# Patient Record
Sex: Male | Born: 1935 | Race: Black or African American | Hispanic: No | Smoking: Never smoker
Health system: Southern US, Community
[De-identification: ages and names within clinical notes are randomized; demographics above are authoritative.]

## PROBLEM LIST (undated history)

## (undated) ENCOUNTER — Emergency Department (HOSPITAL_COMMUNITY): Admission: EM | Payer: Self-pay

## (undated) DIAGNOSIS — D649 Anemia, unspecified: Secondary | ICD-10-CM

## (undated) DIAGNOSIS — M1990 Unspecified osteoarthritis, unspecified site: Secondary | ICD-10-CM

## (undated) DIAGNOSIS — I499 Cardiac arrhythmia, unspecified: Secondary | ICD-10-CM

## (undated) DIAGNOSIS — F32A Depression, unspecified: Secondary | ICD-10-CM

## (undated) DIAGNOSIS — E7800 Pure hypercholesterolemia, unspecified: Secondary | ICD-10-CM

## (undated) DIAGNOSIS — C61 Malignant neoplasm of prostate: Secondary | ICD-10-CM

## (undated) DIAGNOSIS — I82409 Acute embolism and thrombosis of unspecified deep veins of unspecified lower extremity: Secondary | ICD-10-CM

## (undated) DIAGNOSIS — I351 Nonrheumatic aortic (valve) insufficiency: Secondary | ICD-10-CM

## (undated) DIAGNOSIS — Z87442 Personal history of urinary calculi: Secondary | ICD-10-CM

## (undated) DIAGNOSIS — I10 Essential (primary) hypertension: Secondary | ICD-10-CM

## (undated) HISTORY — PX: COLONOSCOPY: SHX174

## (undated) HISTORY — PX: FOOT FRACTURE SURGERY: SHX645

## (undated) HISTORY — PX: VEIN SURGERY: SHX48

## (undated) HISTORY — PX: HERNIA REPAIR: SHX51

---
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
## 2019-01-16 DIAGNOSIS — I82409 Acute embolism and thrombosis of unspecified deep veins of unspecified lower extremity: Secondary | ICD-10-CM

## 2019-01-16 HISTORY — DX: Acute embolism and thrombosis of unspecified deep veins of unspecified lower extremity: I82.409

## 2020-07-10 ENCOUNTER — Emergency Department (HOSPITAL_COMMUNITY): Payer: Medicare HMO

## 2020-07-10 ENCOUNTER — Inpatient Hospital Stay (HOSPITAL_COMMUNITY)
Admission: EM | Admit: 2020-07-10 | Discharge: 2020-07-17 | DRG: 493 | Disposition: A | Discharge status: Home Health | Payer: Medicare HMO | Attending: General Surgery | Admitting: General Surgery

## 2020-07-10 DIAGNOSIS — I82812 Embolism and thrombosis of superficial veins of left lower extremities: Secondary | ICD-10-CM | POA: Diagnosis present

## 2020-07-10 DIAGNOSIS — Z8249 Family history of ischemic heart disease and other diseases of the circulatory system: Secondary | ICD-10-CM

## 2020-07-10 DIAGNOSIS — S8012XA Contusion of left lower leg, initial encounter: Secondary | ICD-10-CM | POA: Diagnosis present

## 2020-07-10 DIAGNOSIS — S8010XA Contusion of unspecified lower leg, initial encounter: Secondary | ICD-10-CM

## 2020-07-10 DIAGNOSIS — I10 Essential (primary) hypertension: Secondary | ICD-10-CM | POA: Diagnosis present

## 2020-07-10 DIAGNOSIS — S82871A Displaced pilon fracture of right tibia, initial encounter for closed fracture: Secondary | ICD-10-CM | POA: Diagnosis not present

## 2020-07-10 DIAGNOSIS — Y92410 Unspecified street and highway as the place of occurrence of the external cause: Secondary | ICD-10-CM

## 2020-07-10 DIAGNOSIS — I480 Paroxysmal atrial fibrillation: Secondary | ICD-10-CM

## 2020-07-10 DIAGNOSIS — S060X9A Concussion with loss of consciousness of unspecified duration, initial encounter: Secondary | ICD-10-CM | POA: Diagnosis present

## 2020-07-10 DIAGNOSIS — K8689 Other specified diseases of pancreas: Secondary | ICD-10-CM | POA: Diagnosis present

## 2020-07-10 DIAGNOSIS — K921 Melena: Secondary | ICD-10-CM | POA: Diagnosis not present

## 2020-07-10 DIAGNOSIS — E1165 Type 2 diabetes mellitus with hyperglycemia: Secondary | ICD-10-CM | POA: Diagnosis present

## 2020-07-10 DIAGNOSIS — M545 Low back pain, unspecified: Secondary | ICD-10-CM | POA: Diagnosis present

## 2020-07-10 DIAGNOSIS — T148XXA Other injury of unspecified body region, initial encounter: Secondary | ICD-10-CM

## 2020-07-10 DIAGNOSIS — Z8546 Personal history of malignant neoplasm of prostate: Secondary | ICD-10-CM

## 2020-07-10 DIAGNOSIS — I959 Hypotension, unspecified: Secondary | ICD-10-CM | POA: Diagnosis present

## 2020-07-10 DIAGNOSIS — S2249XA Multiple fractures of ribs, unspecified side, initial encounter for closed fracture: Secondary | ICD-10-CM

## 2020-07-10 DIAGNOSIS — R0989 Other specified symptoms and signs involving the circulatory and respiratory systems: Secondary | ICD-10-CM | POA: Diagnosis present

## 2020-07-10 DIAGNOSIS — I471 Supraventricular tachycardia: Secondary | ICD-10-CM | POA: Diagnosis present

## 2020-07-10 DIAGNOSIS — T1490XA Injury, unspecified, initial encounter: Secondary | ICD-10-CM

## 2020-07-10 DIAGNOSIS — S2241XA Multiple fractures of ribs, right side, initial encounter for closed fracture: Secondary | ICD-10-CM | POA: Diagnosis present

## 2020-07-10 DIAGNOSIS — D62 Acute posthemorrhagic anemia: Secondary | ICD-10-CM | POA: Diagnosis not present

## 2020-07-10 DIAGNOSIS — Z20822 Contact with and (suspected) exposure to covid-19: Secondary | ICD-10-CM | POA: Diagnosis present

## 2020-07-10 DIAGNOSIS — Z419 Encounter for procedure for purposes other than remedying health state, unspecified: Secondary | ICD-10-CM

## 2020-07-10 DIAGNOSIS — R58 Hemorrhage, not elsewhere classified: Secondary | ICD-10-CM | POA: Diagnosis present

## 2020-07-10 DIAGNOSIS — S299XXA Unspecified injury of thorax, initial encounter: Secondary | ICD-10-CM

## 2020-07-10 DIAGNOSIS — I493 Ventricular premature depolarization: Secondary | ICD-10-CM | POA: Diagnosis present

## 2020-07-10 HISTORY — DX: Essential (primary) hypertension: I10

## 2020-07-10 HISTORY — DX: Malignant neoplasm of prostate: C61

## 2020-07-10 LAB — COMPREHENSIVE METABOLIC PANEL
ALT: 16 U/L (ref 0–44)
AST: 34 U/L (ref 15–41)
Albumin: 3.1 g/dL — ABNORMAL LOW (ref 3.5–5.0)
Alkaline Phosphatase: 70 U/L (ref 38–126)
Anion gap: 9 (ref 5–15)
BUN: 17 mg/dL (ref 8–23)
CO2: 24 mmol/L (ref 22–32)
Calcium: 8.2 mg/dL — ABNORMAL LOW (ref 8.9–10.3)
Chloride: 105 mmol/L (ref 98–111)
Creatinine, Ser: 1.09 mg/dL (ref 0.61–1.24)
GFR, Estimated: >60 mL/min (ref >60)
Glucose, Bld: 180 mg/dL — ABNORMAL HIGH (ref 70–99)
Potassium: 4 mmol/L (ref 3.5–5.1)
Sodium: 138 mmol/L (ref 135–145)
Total Bilirubin: 0.9 mg/dL (ref 0.3–1.2)
Total Protein: 5.7 g/dL — ABNORMAL LOW (ref 6.5–8.1)

## 2020-07-10 LAB — LACTIC ACID, PLASMA: Lactic Acid, Venous: 3.1 mmol/L (ref 0.5–1.9)

## 2020-07-10 LAB — ETHANOL: Alcohol, Ethyl (B): <10 mg/dL (ref <10)

## 2020-07-10 LAB — I-STAT CHEM 8, ED
BUN: 24 mg/dL — ABNORMAL HIGH (ref 8–23)
Calcium, Ion: 1.13 mmol/L — ABNORMAL LOW (ref 1.15–1.40)
Chloride: 103 mmol/L (ref 98–111)
Creatinine, Ser: 1.1 mg/dL (ref 0.61–1.24)
Glucose, Bld: 173 mg/dL — ABNORMAL HIGH (ref 70–99)
HCT: 32 % — ABNORMAL LOW (ref 39.0–52.0)
Hemoglobin: 10.9 g/dL — ABNORMAL LOW (ref 13.0–17.0)
Potassium: 4.6 mmol/L (ref 3.5–5.1)
Sodium: 140 mmol/L (ref 135–145)
TCO2: 29 mmol/L (ref 22–32)

## 2020-07-10 LAB — CBC
HCT: 33.8 % — ABNORMAL LOW (ref 39.0–52.0)
Hemoglobin: 10.5 g/dL — ABNORMAL LOW (ref 13.0–17.0)
MCH: 26.3 pg (ref 26.0–34.0)
MCHC: 31.1 g/dL (ref 30.0–36.0)
MCV: 84.7 fL (ref 80.0–100.0)
Platelets: 149 10*3/uL — ABNORMAL LOW (ref 150–400)
RBC: 3.99 MIL/uL — ABNORMAL LOW (ref 4.22–5.81)
RDW: 13.6 % (ref 11.5–15.5)
WBC: 13.3 10*3/uL — ABNORMAL HIGH (ref 4.0–10.5)
nRBC: 0 % (ref 0.0–0.2)

## 2020-07-10 LAB — PROTIME-INR
INR: 1.1 (ref 0.8–1.2)
Prothrombin Time: 13.6 seconds (ref 11.4–15.2)

## 2020-07-10 MED ORDER — MORPHINE SULFATE (PF) 4 MG/ML IV SOLN
4.0000 mg | Freq: Once | INTRAVENOUS | Status: AC
  Administered 2020-07-10: 4 mg via INTRAVENOUS
  Filled 2020-07-10: qty 1

## 2020-07-10 MED ORDER — ONDANSETRON HCL 4 MG/2ML IJ SOLN
INTRAMUSCULAR | Status: AC
  Filled 2020-07-10: qty 2

## 2020-07-10 MED ORDER — ONDANSETRON HCL 4 MG/2ML IJ SOLN
4.0000 mg | Freq: Once | INTRAMUSCULAR | Status: AC
  Administered 2020-07-10: 4 mg via INTRAVENOUS

## 2020-07-10 MED ORDER — FENTANYL CITRATE (PF) 100 MCG/2ML IJ SOLN
50.0000 ug | Freq: Once | INTRAMUSCULAR | Status: AC
  Administered 2020-07-10: 50 ug via INTRAVENOUS
  Filled 2020-07-10: qty 2

## 2020-07-10 MED ORDER — SODIUM CHLORIDE 0.9 % IV BOLUS
1000.0000 mL | Freq: Once | INTRAVENOUS | Status: AC
  Administered 2020-07-10: 1000 mL via INTRAVENOUS

## 2020-07-10 NOTE — Progress Notes (Signed)
Pt states he has had tetanus shot within the past 5 years.  Also has had x2 COVID vaccines as well as pna vaccine.  TRN asked if I can call any family  -- pt states not at this time, he does not have any family that lives here (they live in [place]).
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
## 2020-07-10 NOTE — ED Notes (Signed)
Paged Dr Rosendo Gros to Dt Tegeler--Pao Haffey
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
## 2020-07-11 ENCOUNTER — Encounter (HOSPITAL_COMMUNITY): Payer: Self-pay | Admitting: Student in an Organized Health Care Education/Training Program

## 2020-07-11 ENCOUNTER — Emergency Department (HOSPITAL_COMMUNITY): Payer: Medicare HMO

## 2020-07-11 ENCOUNTER — Inpatient Hospital Stay (HOSPITAL_COMMUNITY): Payer: Medicare HMO | Admitting: Certified Registered Nurse Anesthetist

## 2020-07-11 ENCOUNTER — Inpatient Hospital Stay (HOSPITAL_COMMUNITY): Payer: Medicare HMO

## 2020-07-11 ENCOUNTER — Other Ambulatory Visit: Payer: Self-pay

## 2020-07-11 ENCOUNTER — Other Ambulatory Visit (HOSPITAL_COMMUNITY): Payer: Self-pay

## 2020-07-11 ENCOUNTER — Encounter (HOSPITAL_COMMUNITY): Admission: EM | Disposition: A | Discharge status: Home Health | Payer: Self-pay | Source: Home / Self Care

## 2020-07-11 DIAGNOSIS — Y92410 Unspecified street and highway as the place of occurrence of the external cause: Secondary | ICD-10-CM | POA: Diagnosis not present

## 2020-07-11 DIAGNOSIS — S2249XA Multiple fractures of ribs, unspecified side, initial encounter for closed fracture: Secondary | ICD-10-CM | POA: Diagnosis present

## 2020-07-11 DIAGNOSIS — I493 Ventricular premature depolarization: Secondary | ICD-10-CM | POA: Diagnosis present

## 2020-07-11 DIAGNOSIS — I471 Supraventricular tachycardia: Secondary | ICD-10-CM | POA: Diagnosis present

## 2020-07-11 DIAGNOSIS — E1165 Type 2 diabetes mellitus with hyperglycemia: Secondary | ICD-10-CM | POA: Diagnosis present

## 2020-07-11 DIAGNOSIS — R58 Hemorrhage, not elsewhere classified: Secondary | ICD-10-CM | POA: Diagnosis present

## 2020-07-11 DIAGNOSIS — K8689 Other specified diseases of pancreas: Secondary | ICD-10-CM | POA: Diagnosis present

## 2020-07-11 DIAGNOSIS — M79609 Pain in unspecified limb: Secondary | ICD-10-CM | POA: Diagnosis not present

## 2020-07-11 DIAGNOSIS — I82812 Embolism and thrombosis of superficial veins of left lower extremities: Secondary | ICD-10-CM | POA: Diagnosis present

## 2020-07-11 DIAGNOSIS — S82871A Displaced pilon fracture of right tibia, initial encounter for closed fracture: Secondary | ICD-10-CM | POA: Diagnosis present

## 2020-07-11 DIAGNOSIS — Z8249 Family history of ischemic heart disease and other diseases of the circulatory system: Secondary | ICD-10-CM | POA: Diagnosis not present

## 2020-07-11 DIAGNOSIS — S2241XA Multiple fractures of ribs, right side, initial encounter for closed fracture: Secondary | ICD-10-CM | POA: Diagnosis present

## 2020-07-11 DIAGNOSIS — I351 Nonrheumatic aortic (valve) insufficiency: Secondary | ICD-10-CM | POA: Diagnosis not present

## 2020-07-11 DIAGNOSIS — R609 Edema, unspecified: Secondary | ICD-10-CM | POA: Diagnosis not present

## 2020-07-11 DIAGNOSIS — I480 Paroxysmal atrial fibrillation: Secondary | ICD-10-CM | POA: Diagnosis present

## 2020-07-11 DIAGNOSIS — S8012XA Contusion of left lower leg, initial encounter: Secondary | ICD-10-CM | POA: Diagnosis present

## 2020-07-11 DIAGNOSIS — S060X9A Concussion with loss of consciousness of unspecified duration, initial encounter: Secondary | ICD-10-CM | POA: Diagnosis present

## 2020-07-11 DIAGNOSIS — I959 Hypotension, unspecified: Secondary | ICD-10-CM | POA: Diagnosis present

## 2020-07-11 DIAGNOSIS — I10 Essential (primary) hypertension: Secondary | ICD-10-CM | POA: Diagnosis present

## 2020-07-11 DIAGNOSIS — M7989 Other specified soft tissue disorders: Secondary | ICD-10-CM | POA: Diagnosis not present

## 2020-07-11 DIAGNOSIS — M545 Low back pain, unspecified: Secondary | ICD-10-CM | POA: Diagnosis present

## 2020-07-11 DIAGNOSIS — Z20822 Contact with and (suspected) exposure to covid-19: Secondary | ICD-10-CM | POA: Diagnosis present

## 2020-07-11 DIAGNOSIS — K921 Melena: Secondary | ICD-10-CM | POA: Diagnosis not present

## 2020-07-11 DIAGNOSIS — Z8546 Personal history of malignant neoplasm of prostate: Secondary | ICD-10-CM | POA: Diagnosis not present

## 2020-07-11 DIAGNOSIS — R0989 Other specified symptoms and signs involving the circulatory and respiratory systems: Secondary | ICD-10-CM | POA: Diagnosis present

## 2020-07-11 DIAGNOSIS — D62 Acute posthemorrhagic anemia: Secondary | ICD-10-CM | POA: Diagnosis not present

## 2020-07-11 HISTORY — PX: EXTERNAL FIXATION LEG: SHX1549

## 2020-07-11 LAB — CBC
HCT: 30.2 % — ABNORMAL LOW (ref 39.0–52.0)
HCT: 30.9 % — ABNORMAL LOW (ref 39.0–52.0)
HCT: 31.5 % — ABNORMAL LOW (ref 39.0–52.0)
HCT: 28.6 % — ABNORMAL LOW (ref 39.0–52.0)
Hemoglobin: 9.4 g/dL — ABNORMAL LOW (ref 13.0–17.0)
Hemoglobin: 9.7 g/dL — ABNORMAL LOW (ref 13.0–17.0)
Hemoglobin: 9.8 g/dL — ABNORMAL LOW (ref 13.0–17.0)
Hemoglobin: 9.2 g/dL — ABNORMAL LOW (ref 13.0–17.0)
MCH: 26.6 pg (ref 26.0–34.0)
MCH: 26.9 pg (ref 26.0–34.0)
MCH: 26.6 pg (ref 26.0–34.0)
MCH: 26.8 pg (ref 26.0–34.0)
MCHC: 31.1 g/dL (ref 30.0–36.0)
MCHC: 31.4 g/dL (ref 30.0–36.0)
MCHC: 31.1 g/dL (ref 30.0–36.0)
MCHC: 32.2 g/dL (ref 30.0–36.0)
MCV: 85.6 fL (ref 80.0–100.0)
MCV: 85.8 fL (ref 80.0–100.0)
MCV: 85.6 fL (ref 80.0–100.0)
MCV: 83.4 fL (ref 80.0–100.0)
Platelets: 149 10*3/uL — ABNORMAL LOW (ref 150–400)
Platelets: 124 10*3/uL — ABNORMAL LOW (ref 150–400)
Platelets: 122 10*3/uL — ABNORMAL LOW (ref 150–400)
Platelets: 114 10*3/uL — ABNORMAL LOW (ref 150–400)
RBC: 3.53 MIL/uL — ABNORMAL LOW (ref 4.22–5.81)
RBC: 3.6 MIL/uL — ABNORMAL LOW (ref 4.22–5.81)
RBC: 3.68 MIL/uL — ABNORMAL LOW (ref 4.22–5.81)
RBC: 3.43 MIL/uL — ABNORMAL LOW (ref 4.22–5.81)
RDW: 13.8 % (ref 11.5–15.5)
RDW: 13.7 % (ref 11.5–15.5)
RDW: 13.6 % (ref 11.5–15.5)
RDW: 13.8 % (ref 11.5–15.5)
WBC: 15.2 10*3/uL — ABNORMAL HIGH (ref 4.0–10.5)
WBC: 11.6 10*3/uL — ABNORMAL HIGH (ref 4.0–10.5)
WBC: 12.1 10*3/uL — ABNORMAL HIGH (ref 4.0–10.5)
WBC: 11.8 10*3/uL — ABNORMAL HIGH (ref 4.0–10.5)
nRBC: 0 % (ref 0.0–0.2)
nRBC: 0 % (ref 0.0–0.2)
nRBC: 0 % (ref 0.0–0.2)
nRBC: 0 % (ref 0.0–0.2)

## 2020-07-11 LAB — URINALYSIS, ROUTINE W REFLEX MICROSCOPIC
Bacteria, UA: NONE SEEN
Bilirubin Urine: NEGATIVE
Glucose, UA: NEGATIVE mg/dL
Ketones, ur: NEGATIVE mg/dL
Leukocytes,Ua: NEGATIVE
Nitrite: NEGATIVE
Protein, ur: NEGATIVE mg/dL
Specific Gravity, Urine: 1.021 (ref 1.005–1.030)
pH: 6 (ref 5.0–8.0)

## 2020-07-11 LAB — HEMOGLOBIN A1C
Hgb A1c MFr Bld: 5.8 % — ABNORMAL HIGH (ref 4.8–5.6)
Mean Plasma Glucose: 120 mg/dL

## 2020-07-11 LAB — MRSA PCR SCREENING: MRSA by PCR: NEGATIVE

## 2020-07-11 LAB — COMPREHENSIVE METABOLIC PANEL
ALT: 15 U/L (ref 0–44)
AST: 34 U/L (ref 15–41)
Albumin: 2.7 g/dL — ABNORMAL LOW (ref 3.5–5.0)
Alkaline Phosphatase: 55 U/L (ref 38–126)
Anion gap: 7 (ref 5–15)
BUN: 13 mg/dL (ref 8–23)
CO2: 25 mmol/L (ref 22–32)
Calcium: 7.4 mg/dL — ABNORMAL LOW (ref 8.9–10.3)
Chloride: 109 mmol/L (ref 98–111)
Creatinine, Ser: 1.02 mg/dL (ref 0.61–1.24)
GFR, Estimated: >60 mL/min (ref >60)
Glucose, Bld: 218 mg/dL — ABNORMAL HIGH (ref 70–99)
Potassium: 4.7 mmol/L (ref 3.5–5.1)
Sodium: 141 mmol/L (ref 135–145)
Total Bilirubin: 1 mg/dL (ref 0.3–1.2)
Total Protein: 5.3 g/dL — ABNORMAL LOW (ref 6.5–8.1)

## 2020-07-11 LAB — RESPIRATORY PANEL BY RT PCR (FLU A&B, COVID)
Influenza A by PCR: NEGATIVE
Influenza B by PCR: NEGATIVE
SARS Coronavirus 2 by RT PCR: NEGATIVE

## 2020-07-11 LAB — CBG MONITORING, ED
Glucose-Capillary: 147 mg/dL — ABNORMAL HIGH (ref 70–99)
Glucose-Capillary: 153 mg/dL — ABNORMAL HIGH (ref 70–99)

## 2020-07-11 LAB — GLUCOSE, CAPILLARY
Glucose-Capillary: 105 mg/dL — ABNORMAL HIGH (ref 70–99)
Glucose-Capillary: 113 mg/dL — ABNORMAL HIGH (ref 70–99)
Glucose-Capillary: 130 mg/dL — ABNORMAL HIGH (ref 70–99)

## 2020-07-11 LAB — ABO/RH: ABO/RH(D): O POS

## 2020-07-11 LAB — LACTIC ACID, PLASMA: Lactic Acid, Venous: 3.5 mmol/L (ref 0.5–1.9)

## 2020-07-11 SURGERY — EXTERNAL FIXATION, LOWER EXTREMITY
Anesthesia: General | Site: Ankle | Laterality: Right

## 2020-07-11 MED ORDER — CHLORHEXIDINE GLUCONATE 4 % EX LIQD
60.0000 mL | Freq: Once | CUTANEOUS | Status: DC
  Filled 2020-07-11: qty 60

## 2020-07-11 MED ORDER — PROPOFOL 10 MG/ML IV BOLUS
INTRAVENOUS | Status: DC | PRN
  Administered 2020-07-11: 100 mg via INTRAVENOUS

## 2020-07-11 MED ORDER — DEXAMETHASONE SODIUM PHOSPHATE 10 MG/ML IJ SOLN
INTRAMUSCULAR | Status: AC
  Filled 2020-07-11: qty 1

## 2020-07-11 MED ORDER — DEXAMETHASONE SODIUM PHOSPHATE 10 MG/ML IJ SOLN
INTRAMUSCULAR | Status: DC | PRN
  Administered 2020-07-11: 5 mg via INTRAVENOUS

## 2020-07-11 MED ORDER — FENTANYL CITRATE (PF) 100 MCG/2ML IJ SOLN
INTRAMUSCULAR | Status: AC
  Administered 2020-07-11: 50 ug via INTRAVENOUS
  Filled 2020-07-11: qty 2

## 2020-07-11 MED ORDER — PROPOFOL 10 MG/ML IV BOLUS
INTRAVENOUS | Status: AC
  Filled 2020-07-11: qty 40

## 2020-07-11 MED ORDER — EPHEDRINE 5 MG/ML INJ
INTRAVENOUS | Status: AC
  Filled 2020-07-11: qty 10

## 2020-07-11 MED ORDER — OXYCODONE HCL 5 MG PO TABS: 5.0000 mg | ORAL_TABLET | Freq: Once | ORAL | Status: DC | PRN

## 2020-07-11 MED ORDER — ONDANSETRON HCL 4 MG/2ML IJ SOLN: 4.0000 mg | Freq: Four times a day (QID) | INTRAMUSCULAR | Status: DC | PRN

## 2020-07-11 MED ORDER — ONDANSETRON HCL 4 MG/2ML IJ SOLN: 4.0000 mg | Freq: Once | INTRAMUSCULAR | Status: DC | PRN

## 2020-07-11 MED ORDER — FENTANYL CITRATE (PF) 250 MCG/5ML IJ SOLN
INTRAMUSCULAR | Status: DC | PRN
  Administered 2020-07-11: 50 ug via INTRAVENOUS

## 2020-07-11 MED ORDER — FENTANYL CITRATE (PF) 100 MCG/2ML IJ SOLN
25.0000 ug | INTRAMUSCULAR | Status: DC | PRN
  Administered 2020-07-11: 50 ug via INTRAVENOUS

## 2020-07-11 MED ORDER — IOHEXOL 350 MG/ML SOLN
100.0000 mL | Freq: Once | INTRAVENOUS | Status: AC | PRN
  Administered 2020-07-11: 100 mL via INTRAVENOUS

## 2020-07-11 MED ORDER — TAMSULOSIN HCL 0.4 MG PO CAPS
0.4000 mg | ORAL_CAPSULE | Freq: Every day | ORAL | Status: DC
  Administered 2020-07-12 – 2020-07-17 (×6): 0.4 mg via ORAL
  Filled 2020-07-11 (×6): qty 1

## 2020-07-11 MED ORDER — ONDANSETRON 4 MG PO TBDP: 4.0000 mg | ORAL_TABLET | Freq: Four times a day (QID) | ORAL | Status: DC | PRN

## 2020-07-11 MED ORDER — PHENYLEPHRINE 40 MCG/ML (10ML) SYRINGE FOR IV PUSH (FOR BLOOD PRESSURE SUPPORT)
PREFILLED_SYRINGE | INTRAVENOUS | Status: DC | PRN
  Administered 2020-07-11: 120 ug via INTRAVENOUS

## 2020-07-11 MED ORDER — CHLORHEXIDINE GLUCONATE 0.12 % MT SOLN: 15.0000 mL | Freq: Once | OROMUCOSAL | Status: AC

## 2020-07-11 MED ORDER — PHENYLEPHRINE 40 MCG/ML (10ML) SYRINGE FOR IV PUSH (FOR BLOOD PRESSURE SUPPORT)
PREFILLED_SYRINGE | INTRAVENOUS | Status: AC
  Filled 2020-07-11: qty 10

## 2020-07-11 MED ORDER — EPHEDRINE SULFATE-NACL 50-0.9 MG/10ML-% IV SOSY
PREFILLED_SYRINGE | INTRAVENOUS | Status: DC | PRN
  Administered 2020-07-11 (×2): 10 mg via INTRAVENOUS

## 2020-07-11 MED ORDER — PHENYLEPHRINE HCL-NACL 10-0.9 MG/250ML-% IV SOLN
INTRAVENOUS | Status: DC | PRN
  Administered 2020-07-11: 25 ug/min via INTRAVENOUS

## 2020-07-11 MED ORDER — MORPHINE SULFATE (PF) 2 MG/ML IV SOLN
2.0000 mg | INTRAVENOUS | Status: DC | PRN
  Administered 2020-07-11: 2 mg via INTRAVENOUS
  Filled 2020-07-11: qty 1

## 2020-07-11 MED ORDER — 0.9 % SODIUM CHLORIDE (POUR BTL) OPTIME
TOPICAL | Status: DC | PRN
  Administered 2020-07-11: 1000 mL

## 2020-07-11 MED ORDER — LACTATED RINGERS IV SOLN: INTRAVENOUS | Status: DC | PRN

## 2020-07-11 MED ORDER — KCL IN DEXTROSE-NACL 20-5-0.45 MEQ/L-%-% IV SOLN
INTRAVENOUS | Status: DC
  Filled 2020-07-11 (×4): qty 1000

## 2020-07-11 MED ORDER — ENOXAPARIN SODIUM 40 MG/0.4ML ~~LOC~~ SOLN: 40.0000 mg | SUBCUTANEOUS | Status: DC

## 2020-07-11 MED ORDER — OXYCODONE HCL 5 MG/5ML PO SOLN: 5.0000 mg | Freq: Once | ORAL | Status: DC | PRN

## 2020-07-11 MED ORDER — AMISULPRIDE (ANTIEMETIC) 5 MG/2ML IV SOLN: 10.0000 mg | Freq: Once | INTRAVENOUS | Status: DC | PRN

## 2020-07-11 MED ORDER — OXYCODONE HCL 5 MG PO TABS
5.0000 mg | ORAL_TABLET | ORAL | Status: DC | PRN
  Administered 2020-07-12: 10 mg via ORAL
  Filled 2020-07-11: qty 2

## 2020-07-11 MED ORDER — ONDANSETRON HCL 4 MG/2ML IJ SOLN
INTRAMUSCULAR | Status: AC
  Filled 2020-07-11: qty 2

## 2020-07-11 MED ORDER — HYDROMORPHONE HCL 1 MG/ML IJ SOLN
1.0000 mg | INTRAMUSCULAR | Status: DC | PRN
  Filled 2020-07-11: qty 1

## 2020-07-11 MED ORDER — FENTANYL CITRATE (PF) 250 MCG/5ML IJ SOLN
INTRAMUSCULAR | Status: AC
  Filled 2020-07-11: qty 5

## 2020-07-11 MED ORDER — INSULIN ASPART 100 UNIT/ML ~~LOC~~ SOLN
0.0000 [IU] | SUBCUTANEOUS | Status: DC
  Administered 2020-07-11: 2 [IU] via SUBCUTANEOUS
  Administered 2020-07-12: 3 [IU] via SUBCUTANEOUS
  Administered 2020-07-12 – 2020-07-13 (×2): 2 [IU] via SUBCUTANEOUS

## 2020-07-11 MED ORDER — CHLORHEXIDINE GLUCONATE 0.12 % MT SOLN
OROMUCOSAL | Status: AC
  Administered 2020-07-11: 15 mL via OROMUCOSAL
  Filled 2020-07-11: qty 15

## 2020-07-11 MED ORDER — LACTATED RINGERS IV SOLN: INTRAVENOUS | Status: DC

## 2020-07-11 MED ORDER — ACETAMINOPHEN 325 MG PO TABS
650.0000 mg | ORAL_TABLET | Freq: Four times a day (QID) | ORAL | Status: DC
  Administered 2020-07-11 – 2020-07-17 (×23): 650 mg via ORAL
  Filled 2020-07-11 (×23): qty 2

## 2020-07-11 MED ORDER — CEFAZOLIN SODIUM-DEXTROSE 2-4 GM/100ML-% IV SOLN
2.0000 g | INTRAVENOUS | Status: AC
  Administered 2020-07-11: 2 g via INTRAVENOUS
  Filled 2020-07-11: qty 100

## 2020-07-11 MED ORDER — POVIDONE-IODINE 10 % EX SWAB: 2.0000 "application " | Freq: Once | CUTANEOUS | Status: DC

## 2020-07-11 SURGICAL SUPPLY — 54 items
BAR GLASS FIBER EXFX 11X150 (EXFIX) ×1 IMPLANT
BAR GLASS FIBER EXFX 11X200 (EXFIX) ×1 IMPLANT
BAR GLASS FIBER EXFX 11X400 (EXFIX) ×2 IMPLANT
BIT DRILL 3.2 XTRAFIX BLUE (BIT) ×1 IMPLANT
BIT DRILL CANN MED FLUTE 4.0 (BIT) IMPLANT
BNDG COHESIVE 4X5 TAN STRL (GAUZE/BANDAGES/DRESSINGS) ×2 IMPLANT
BNDG ELASTIC 4X5.8 VLCR STR LF (GAUZE/BANDAGES/DRESSINGS) ×2 IMPLANT
BNDG ELASTIC 6X5.8 VLCR STR LF (GAUZE/BANDAGES/DRESSINGS) ×2 IMPLANT
BNDG GAUZE ELAST 4 BULKY (GAUZE/BANDAGES/DRESSINGS) ×3 IMPLANT
BRUSH SCRUB EZ PLAIN DRY (MISCELLANEOUS) ×4 IMPLANT
CAP PROTECTIVE TRANSFX 4.5X5MM (EXFIX) ×1 IMPLANT
CHLORAPREP W/TINT 26 (MISCELLANEOUS) ×2 IMPLANT
CLAMP BLUE BAR TO BAR (EXFIX) ×2 IMPLANT
CLAMP BLUE BAR TO PIN (EXFIX) ×4 IMPLANT
COVER SURGICAL LIGHT HANDLE (MISCELLANEOUS) ×4 IMPLANT
COVER WAND RF STERILE (DRAPES) ×1 IMPLANT
DRAPE C-ARM 42X72 X-RAY (DRAPES) IMPLANT
DRAPE C-ARMOR (DRAPES) ×2 IMPLANT
DRAPE IMP U-DRAPE 54X76 (DRAPES) ×4 IMPLANT
DRAPE ORTHO SPLIT 77X108 STRL (DRAPES) ×2
DRAPE SURG ORHT 6 SPLT 77X108 (DRAPES) ×2 IMPLANT
DRAPE U-SHAPE 47X51 STRL (DRAPES) ×2 IMPLANT
DRILL CANN 4.0MM (BIT) ×2
DRSG MEPITEL 4X7.2 (GAUZE/BANDAGES/DRESSINGS) IMPLANT
ELECT REM PT RETURN 9FT ADLT (ELECTROSURGICAL) ×2
ELECTRODE REM PT RTRN 9FT ADLT (ELECTROSURGICAL) ×1 IMPLANT
GAUZE SPONGE 4X4 12PLY STRL (GAUZE/BANDAGES/DRESSINGS) ×2 IMPLANT
GLOVE BIO SURGEON STRL SZ 6.5 (GLOVE) ×6 IMPLANT
GLOVE BIO SURGEON STRL SZ7.5 (GLOVE) ×8 IMPLANT
GLOVE BIOGEL PI IND STRL 6.5 (GLOVE) ×1 IMPLANT
GLOVE BIOGEL PI IND STRL 7.5 (GLOVE) ×1 IMPLANT
GLOVE BIOGEL PI INDICATOR 6.5 (GLOVE) ×1
GLOVE BIOGEL PI INDICATOR 7.5 (GLOVE) ×1
GOWN STRL REUS W/ TWL LRG LVL3 (GOWN DISPOSABLE) ×2 IMPLANT
GOWN STRL REUS W/TWL LRG LVL3 (GOWN DISPOSABLE) ×2
KIT BASIN OR (CUSTOM PROCEDURE TRAY) ×2 IMPLANT
KIT TURNOVER KIT B (KITS) ×2 IMPLANT
MANIFOLD NEPTUNE II (INSTRUMENTS) ×2 IMPLANT
NEEDLE 22X1 1/2 (OR ONLY) (NEEDLE) IMPLANT
NS IRRIG 1000ML POUR BTL (IV SOLUTION) ×2 IMPLANT
PACK ORTHO EXTREMITY (CUSTOM PROCEDURE TRAY) ×2 IMPLANT
PAD ARMBOARD 7.5X6 YLW CONV (MISCELLANEOUS) ×4 IMPLANT
PADDING CAST COTTON 6X4 STRL (CAST SUPPLIES) ×3 IMPLANT
PIN 4X100X20MM EXFIX LG BLUNT (EXFIX) ×2 IMPLANT
PIN CLAMP 2BAR 75MM BLUE (EXFIX) ×1 IMPLANT
PIN HALF 5X160X35 BLUNT TIP (EXFIX) ×2 IMPLANT
PIN TRANSFIXING 5.0 (EXFIX) ×1 IMPLANT
SPONGE LAP 18X18 RF (DISPOSABLE) ×2 IMPLANT
STAPLER VISISTAT 35W (STAPLE) ×2 IMPLANT
STRIP CLOSURE SKIN 1/2X4 (GAUZE/BANDAGES/DRESSINGS) IMPLANT
SUT PROLENE 0 CT (SUTURE) IMPLANT
TOWEL GREEN STERILE (TOWEL DISPOSABLE) ×4 IMPLANT
TOWEL GREEN STERILE FF (TOWEL DISPOSABLE) ×4 IMPLANT
UNDERPAD 30X36 HEAVY ABSORB (UNDERPADS AND DIAPERS) ×2 IMPLANT

## 2020-07-11 NOTE — ED Notes (Signed)
Valuables inventoried and locked in security

## 2020-07-11 NOTE — ED Notes (Addendum)
Patient became hypotensive while in bed. Layed down and  Dr. Roxanne Mins made aware. Emergency release blood initiated. Trauma resident at bedside.

## 2020-07-11 NOTE — Anesthesia Postprocedure Evaluation (Signed)
Anesthesia Post Note  Patient: Dillon Tran  Procedure(s) Performed: EXTERNAL FIXATION LEG (Right Ankle)     Patient location during evaluation: PACU Anesthesia Type: General Level of consciousness: awake and alert Pain management: pain level controlled Vital Signs Assessment: post-procedure vital signs reviewed and stable Respiratory status: spontaneous breathing, nonlabored ventilation and respiratory function stable Cardiovascular status: blood pressure returned to baseline and stable Postop Assessment: no apparent nausea or vomiting Anesthetic complications: no   No complications documented.  Last Vitals:  Vitals:   07/11/20 1100 07/11/20 1107  BP: 120/76   Pulse: 91   Resp: (!) 21   Temp:  36.9 C  SpO2: 97%     Last Pain:  Vitals:   07/11/20 1130  TempSrc:   PainSc: 0-No pain                 Lidia Collum
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
## 2020-07-11 NOTE — Progress Notes (Signed)
Orthopedic Tech Progress Note Patient Details:  Dillon Tran 28-Apr-1934 [id number]  Ortho Devices Type of Ortho Device: Post (short leg) splint, Stirrup splint Ortho Device/Splint Location: rle Ortho Device/Splint Interventions: Ordered, Application, Adjustment   Post Interventions Patient Tolerated: Well Instructions Provided: Care of device, Adjustment of device   Karolee Stamps 07/11/2020, 1:18 AM
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
## 2020-07-11 NOTE — Progress Notes (Signed)
Pt is scheduled for external fixators for ankle fracture later today.  Follow up when MD requests.   07/11/20 1100  PT Visit Information  Reason Eval/Treat Not Completed Medical issues which prohibited therapy    Mee Hives, PT MS Acute Rehab Dept. Number: [place]

## 2020-07-11 NOTE — Progress Notes (Signed)
Daughter - Phill Myron - updated via phone, she lives in [place] and will plan to travel down. [phone]

## 2020-07-11 NOTE — ED Provider Notes (Signed)
Called to see the patient because of recurrent hypotension.  Patient is noted to be diaphoretic with blood pressure 58 systolic.  He is given additional IV fluids.  He reports were reviewed.  CT chest, abdomen, pelvis showed soft tissue hematoma and contusion overlying the right abdominal wall and pelvis with areas of active extravasation, right posterior upper abdominal retroperitoneal hemorrhage with areas of active extravasation.  Given patient's recurrent episodes of hypotension and areas of active extravasation, he will be given blood transfusion.  I have talked with Dr. Rob Hickman of trauma service who is coming to evaluate the patient.  CRITICAL CARE Performed by: Delora Fuel Total critical care time: 35 minutes Critical care time was exclusive of separately billable procedures and treating other patients. Critical care was necessary to treat or prevent imminent or life-threatening deterioration. Critical care was time spent personally by me on the following activities: development of treatment plan with patient and/or surrogate as well as nursing, discussions with consultants, evaluation of patient's response to treatment, examination of patient, obtaining history from patient or surrogate, ordering and performing treatments and interventions, ordering and review of laboratory studies, ordering and review of radiographic studies, pulse oximetry and re-evaluation of patient's condition.   Delora Fuel, MD 12/29/62 [phone]

## 2020-07-11 NOTE — ED Notes (Signed)
Patient assessed for blood product reaction and none noted at this time.

## 2020-07-11 NOTE — Anesthesia Procedure Notes (Signed)
Procedure Name: LMA Insertion Date/Time: 07/11/2020 1:08 PM Performed by: Rande Brunt, CRNA Pre-anesthesia Checklist: Patient identified, Emergency Drugs available, Suction available and Patient being monitored Patient Re-evaluated:Patient Re-evaluated prior to induction Oxygen Delivery Method: Circle System Utilized Preoxygenation: Pre-oxygenation with 100% oxygen Induction Type: IV induction Ventilation: Mask ventilation without difficulty LMA: LMA inserted LMA Size: 4.0 Number of attempts: 1 Placement Confirmation: positive ETCO2 Tube secured with: Tape Dental Injury: Teeth and Oropharynx as per pre-operative assessment

## 2020-07-11 NOTE — Progress Notes (Signed)
Patient arrived to unit from PACU. VS WNL. No complaints of pain or distress at this time. Oriented to unit. Granddaughter at bedside. Spoke with Dr. Kieth Brightly to advance diet d/t injuries and concussion will continue NPO at this time. Bed in low position, wheels locked, call light in reach.

## 2020-07-11 NOTE — ED Notes (Signed)
Patient became hypotensive and diaphoretic. Provider called to bedside and fluids initiated.

## 2020-07-11 NOTE — Transfer of Care (Signed)
Immediate Anesthesia Transfer of Care Note  Patient: Dillon Tran  Procedure(s) Performed: EXTERNAL FIXATION LEG (Right Ankle)  Patient Location: PACU  Anesthesia Type:General  Level of Consciousness: drowsy and patient cooperative  Airway & Oxygen Therapy: Patient Spontanous Breathing and Patient connected to face mask oxygen  Post-op Assessment: Report given to RN, Post -op Vital signs reviewed and stable and Patient moving all extremities X 4  Post vital signs: Reviewed and stable  Last Vitals:  Vitals Value Taken Time  BP 140/82   Temp    Pulse 92 07/11/20 1356  Resp 31 07/11/20 1356  SpO2 100 % 07/11/20 1356  Vitals shown include unvalidated device data.  Last Pain:  Vitals:   07/11/20 1130  TempSrc:   PainSc: 0-No pain         Complications: No complications documented.

## 2020-07-12 ENCOUNTER — Inpatient Hospital Stay (HOSPITAL_COMMUNITY): Payer: Medicare HMO

## 2020-07-12 ENCOUNTER — Encounter (HOSPITAL_COMMUNITY): Payer: Self-pay | Admitting: Student

## 2020-07-12 DIAGNOSIS — R609 Edema, unspecified: Secondary | ICD-10-CM

## 2020-07-12 DIAGNOSIS — M79609 Pain in unspecified limb: Secondary | ICD-10-CM | POA: Diagnosis not present

## 2020-07-12 DIAGNOSIS — M7989 Other specified soft tissue disorders: Secondary | ICD-10-CM

## 2020-07-12 LAB — COMPREHENSIVE METABOLIC PANEL
ALT: 22 U/L (ref 0–44)
AST: 51 U/L — ABNORMAL HIGH (ref 15–41)
Albumin: 3.1 g/dL — ABNORMAL LOW (ref 3.5–5.0)
Alkaline Phosphatase: 56 U/L (ref 38–126)
Anion gap: 10 (ref 5–15)
BUN: 10 mg/dL (ref 8–23)
CO2: 24 mmol/L (ref 22–32)
Calcium: 8 mg/dL — ABNORMAL LOW (ref 8.9–10.3)
Chloride: 105 mmol/L (ref 98–111)
Creatinine, Ser: 1.08 mg/dL (ref 0.61–1.24)
GFR, Estimated: >60 mL/min (ref >60)
Glucose, Bld: 115 mg/dL — ABNORMAL HIGH (ref 70–99)
Potassium: 3.7 mmol/L (ref 3.5–5.1)
Sodium: 139 mmol/L (ref 135–145)
Total Bilirubin: 0.9 mg/dL (ref 0.3–1.2)
Total Protein: 6 g/dL — ABNORMAL LOW (ref 6.5–8.1)

## 2020-07-12 LAB — CBC
HCT: 26.9 % — ABNORMAL LOW (ref 39.0–52.0)
HCT: 26.5 % — ABNORMAL LOW (ref 39.0–52.0)
HCT: 25.2 % — ABNORMAL LOW (ref 39.0–52.0)
Hemoglobin: 8.7 g/dL — ABNORMAL LOW (ref 13.0–17.0)
Hemoglobin: 8.6 g/dL — ABNORMAL LOW (ref 13.0–17.0)
Hemoglobin: 8.1 g/dL — ABNORMAL LOW (ref 13.0–17.0)
MCH: 26.9 pg (ref 26.0–34.0)
MCH: 27 pg (ref 26.0–34.0)
MCH: 26.6 pg (ref 26.0–34.0)
MCHC: 32.3 g/dL (ref 30.0–36.0)
MCHC: 32.5 g/dL (ref 30.0–36.0)
MCHC: 32.1 g/dL (ref 30.0–36.0)
MCV: 83 fL (ref 80.0–100.0)
MCV: 83.1 fL (ref 80.0–100.0)
MCV: 82.9 fL (ref 80.0–100.0)
Platelets: 115 10*3/uL — ABNORMAL LOW (ref 150–400)
Platelets: 125 10*3/uL — ABNORMAL LOW (ref 150–400)
Platelets: 113 10*3/uL — ABNORMAL LOW (ref 150–400)
RBC: 3.24 MIL/uL — ABNORMAL LOW (ref 4.22–5.81)
RBC: 3.19 MIL/uL — ABNORMAL LOW (ref 4.22–5.81)
RBC: 3.04 MIL/uL — ABNORMAL LOW (ref 4.22–5.81)
RDW: 13.9 % (ref 11.5–15.5)
RDW: 13.9 % (ref 11.5–15.5)
RDW: 14 % (ref 11.5–15.5)
WBC: 11.1 10*3/uL — ABNORMAL HIGH (ref 4.0–10.5)
WBC: 10.2 10*3/uL (ref 4.0–10.5)
WBC: 9 10*3/uL (ref 4.0–10.5)
nRBC: 0 % (ref 0.0–0.2)
nRBC: 0 % (ref 0.0–0.2)
nRBC: 0 % (ref 0.0–0.2)

## 2020-07-12 LAB — GLUCOSE, CAPILLARY
Glucose-Capillary: 103 mg/dL — ABNORMAL HIGH (ref 70–99)
Glucose-Capillary: 119 mg/dL — ABNORMAL HIGH (ref 70–99)
Glucose-Capillary: 147 mg/dL — ABNORMAL HIGH (ref 70–99)
Glucose-Capillary: 154 mg/dL — ABNORMAL HIGH (ref 70–99)
Glucose-Capillary: 83 mg/dL (ref 70–99)
Glucose-Capillary: 92 mg/dL (ref 70–99)

## 2020-07-12 LAB — VITAMIN D 25 HYDROXY (VIT D DEFICIENCY, FRACTURES): Vit D, 25-Hydroxy: 23.94 ng/mL — ABNORMAL LOW (ref 30–100)

## 2020-07-12 MED ORDER — GABAPENTIN 100 MG PO CAPS
100.0000 mg | ORAL_CAPSULE | Freq: Three times a day (TID) | ORAL | Status: DC
  Administered 2020-07-12 – 2020-07-17 (×16): 100 mg via ORAL
  Filled 2020-07-12 (×16): qty 1

## 2020-07-12 MED ORDER — VITAMIN D 25 MCG (1000 UNIT) PO TABS
2000.0000 [IU] | ORAL_TABLET | Freq: Every day | ORAL | Status: DC
  Administered 2020-07-13 – 2020-07-17 (×5): 2000 [IU] via ORAL
  Filled 2020-07-12 (×5): qty 2

## 2020-07-12 MED ORDER — ASPIRIN EC 81 MG PO TBEC
81.0000 mg | DELAYED_RELEASE_TABLET | Freq: Every day | ORAL | Status: DC
  Administered 2020-07-12 – 2020-07-17 (×6): 81 mg via ORAL
  Filled 2020-07-12 (×6): qty 1

## 2020-07-12 MED ORDER — METOCLOPRAMIDE HCL 5 MG/ML IJ SOLN: 5.0000 mg | Freq: Three times a day (TID) | INTRAMUSCULAR | Status: DC | PRN

## 2020-07-12 MED ORDER — METHOCARBAMOL 500 MG PO TABS
500.0000 mg | ORAL_TABLET | Freq: Four times a day (QID) | ORAL | Status: DC | PRN
  Administered 2020-07-12: 500 mg via ORAL
  Filled 2020-07-12: qty 1

## 2020-07-12 MED ORDER — METHOCARBAMOL 1000 MG/10ML IJ SOLN
500.0000 mg | Freq: Four times a day (QID) | INTRAVENOUS | Status: DC | PRN
  Filled 2020-07-12: qty 5

## 2020-07-12 MED ORDER — SODIUM CHLORIDE 0.9 % IV SOLN: INTRAVENOUS | Status: DC

## 2020-07-12 MED ORDER — ENOXAPARIN SODIUM 40 MG/0.4ML ~~LOC~~ SOLN
40.0000 mg | SUBCUTANEOUS | Status: DC
  Administered 2020-07-12 – 2020-07-17 (×6): 40 mg via SUBCUTANEOUS
  Filled 2020-07-12 (×6): qty 0.4

## 2020-07-12 MED ORDER — METOCLOPRAMIDE HCL 10 MG PO TABS: 5.0000 mg | ORAL_TABLET | Freq: Three times a day (TID) | ORAL | Status: DC | PRN

## 2020-07-12 MED ORDER — POLYETHYLENE GLYCOL 3350 17 G PO PACK
17.0000 g | PACK | Freq: Every day | ORAL | Status: DC | PRN
  Administered 2020-07-13: 17 g via ORAL
  Filled 2020-07-12: qty 1

## 2020-07-12 MED ORDER — HYDROMORPHONE HCL 1 MG/ML IJ SOLN: 0.5000 mg | INTRAMUSCULAR | Status: DC | PRN

## 2020-07-12 MED ORDER — CEFAZOLIN SODIUM-DEXTROSE 2-4 GM/100ML-% IV SOLN
2.0000 g | Freq: Two times a day (BID) | INTRAVENOUS | Status: AC
  Administered 2020-07-12 (×2): 2 g via INTRAVENOUS
  Filled 2020-07-12 (×2): qty 100

## 2020-07-12 MED ORDER — AMLODIPINE BESYLATE 5 MG PO TABS
5.0000 mg | ORAL_TABLET | Freq: Every day | ORAL | Status: DC
  Administered 2020-07-12 – 2020-07-17 (×6): 5 mg via ORAL
  Filled 2020-07-12 (×6): qty 1

## 2020-07-12 MED ORDER — ONDANSETRON HCL 4 MG PO TABS: 4.0000 mg | ORAL_TABLET | Freq: Four times a day (QID) | ORAL | Status: DC | PRN

## 2020-07-12 MED ORDER — DOCUSATE SODIUM 100 MG PO CAPS
100.0000 mg | ORAL_CAPSULE | Freq: Two times a day (BID) | ORAL | Status: DC
  Administered 2020-07-12 – 2020-07-17 (×11): 100 mg via ORAL
  Filled 2020-07-12 (×11): qty 1

## 2020-07-12 MED ORDER — ONDANSETRON HCL 4 MG/2ML IJ SOLN: 4.0000 mg | Freq: Four times a day (QID) | INTRAMUSCULAR | Status: DC | PRN
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
## 2020-07-12 NOTE — Progress Notes (Signed)
Rehab Admissions Coordinator Note:  Patient was screened by Cleatrice Burke for appropriateness for an Inpatient Acute Rehab Consult per therapy recommendations. Noted further surgery pending and pt lives alone pta..  Await further progress and surgery. He may need to consider other rehab venues with limited assistance. I will follow.  Cleatrice Burke RN MSN 07/12/2020, 1:23 PM  I can be reached at [phone].
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
## 2020-07-12 NOTE — TOC CAGE-AID Note (Signed)
Transition of Care [facility]) - CAGE-AID Screening   Patient Details  Name: Dillon Tran MRN: [id number] Date of Birth: [birth year]  Transition of [facility]) CM/SW Contact:    Emeterio Reeve, [place] Phone Number: 07/12/2020, 3:45 PM   Clinical Narrative: CSW met with pt at bedside. CSW introduced self and explained role at the hospital.  Pt denies alcohol use. Pt denies substance use. Pt did not need any resources at this time.      CAGE-AID Screening:    Have You Ever Felt You Ought to Cut Down on Your Drinking or Drug Use?: No Have People Annoyed You By Critizing Your Drinking Or Drug Use?: No Have You Felt Bad Or Guilty About Your Drinking Or Drug Use?: No Have You Ever Had a Drink or Used Drugs First Thing In The Morning to Steady Your Nerves or to Get Rid of a Hangover?: No CAGE-AID Score: 0  Substance Abuse Education Offered: Yes     Blima Ledger, [place] Social Worker [phone]

## 2020-07-12 NOTE — Progress Notes (Signed)
Facial ticks noted during assessment.  Per day shift report facial tick was noted prior to admission. 

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
## 2020-07-12 NOTE — Progress Notes (Signed)
Left Lower Ext. study completed.   See CVProc for preliminary results.   Roper Tolson, RDMS, RVT 

## 2020-07-12 NOTE — Progress Notes (Signed)
Patient off the floor for xray.

## 2020-07-13 LAB — CBC
HCT: 23 % — ABNORMAL LOW (ref 39.0–52.0)
Hemoglobin: 7.6 g/dL — ABNORMAL LOW (ref 13.0–17.0)
MCH: 27.6 pg (ref 26.0–34.0)
MCHC: 33 g/dL (ref 30.0–36.0)
MCV: 83.6 fL (ref 80.0–100.0)
Platelets: 112 10*3/uL — ABNORMAL LOW (ref 150–400)
RBC: 2.75 MIL/uL — ABNORMAL LOW (ref 4.22–5.81)
RDW: 14.1 % (ref 11.5–15.5)
WBC: 8.6 10*3/uL (ref 4.0–10.5)
nRBC: 0 % (ref 0.0–0.2)

## 2020-07-13 LAB — BASIC METABOLIC PANEL
Anion gap: 5 (ref 5–15)
BUN: 13 mg/dL (ref 8–23)
CO2: 28 mmol/L (ref 22–32)
Calcium: 8.2 mg/dL — ABNORMAL LOW (ref 8.9–10.3)
Chloride: 106 mmol/L (ref 98–111)
Creatinine, Ser: 1.08 mg/dL (ref 0.61–1.24)
GFR, Estimated: >60 mL/min (ref >60)
Glucose, Bld: 126 mg/dL — ABNORMAL HIGH (ref 70–99)
Potassium: 3.8 mmol/L (ref 3.5–5.1)
Sodium: 139 mmol/L (ref 135–145)

## 2020-07-13 LAB — GLUCOSE, CAPILLARY
Glucose-Capillary: 106 mg/dL — ABNORMAL HIGH (ref 70–99)
Glucose-Capillary: 142 mg/dL — ABNORMAL HIGH (ref 70–99)
Glucose-Capillary: 103 mg/dL — ABNORMAL HIGH (ref 70–99)
Glucose-Capillary: 159 mg/dL — ABNORMAL HIGH (ref 70–99)

## 2020-07-13 MED ORDER — METOPROLOL TARTRATE 5 MG/5ML IV SOLN
5.0000 mg | Freq: Four times a day (QID) | INTRAVENOUS | Status: DC | PRN
  Administered 2020-07-13: 5 mg via INTRAVENOUS
  Filled 2020-07-13: qty 5

## 2020-07-13 MED ORDER — CALCIUM CARBONATE ANTACID 500 MG PO CHEW
400.0000 mg | CHEWABLE_TABLET | Freq: Three times a day (TID) | ORAL | Status: AC
  Administered 2020-07-13 – 2020-07-14 (×3): 400 mg via ORAL
  Filled 2020-07-13 (×3): qty 2

## 2020-07-13 MED ORDER — METOPROLOL TARTRATE 5 MG/5ML IV SOLN
5.0000 mg | Freq: Once | INTRAVENOUS | Status: AC
  Administered 2020-07-13: 5 mg via INTRAVENOUS
  Filled 2020-07-13: qty 5
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
## 2020-07-13 NOTE — Progress Notes (Signed)
Inpatient Rehabilitation Admissions Coordinator  Noted patient is min guard assist and if has 24/7 , Physical Therapy states today would recommend d/c home when medically ready. [employer] unlikely to approve a CIR admit at this high functional level.  Danne Baxter, RN, MSN Rehab Admissions Coordinator [phone] 07/13/2020 1:47 PM
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
## 2020-07-13 NOTE — Progress Notes (Signed)
Patient eating dinner noted his HR sustaining 110-120. Spoke with Dr. Bobbye Morton verbal order given for metoprolol 5 mg IV. Will continue to monitor.

## 2020-07-13 NOTE — Progress Notes (Signed)
EKG tech in room noted patient converted from a-fib noticed while patient was eating HR would increase to 130-140 then would decreased to 97-110 while he was not eating. Spoke with Barkley Boards, PA-C verbal order given to still administer lopressor 5 mg IV. Will continue with plan of care and monitor HR and BP in 15-30 mins after administering medication.

## 2020-07-13 NOTE — Progress Notes (Signed)
   07/13/20 1316  Assess: MEWS Score  BP 123/80  ECG Heart Rate (!) 119  Resp (!) 21  Assess: MEWS Score  MEWS Temp 0  MEWS Systolic 0  MEWS Pulse 2  MEWS RR 1  MEWS LOC 0  MEWS Score 3  MEWS Score Color Yellow  Assess: if the MEWS score is Yellow or Red  Were vital signs taken at a resting state? Yes  Focused Assessment No change from prior assessment  Early Detection of Sepsis Score *See Row Information* Low  MEWS guidelines implemented *See Row Information* Yes  Treat  MEWS Interventions Administered prn meds/treatments;Escalated (See documentation below)  Pain Scale 0-10  Pain Score 0  Take Vital Signs  Increase Vital Sign Frequency  Yellow: Q 2hr X 2 then Q 4hr X 2, if remains yellow, continue Q 4hrs  Escalate  MEWS: Escalate Yellow: discuss with charge nurse/RN and consider discussing with provider and RRT  Notify: Charge Nurse/RN  Name of Charge Nurse/RN Notified Nicole, RN   Date Charge Nurse/RN Notified 07/13/20  Time Charge Nurse/RN Notified 1316  Notify: Provider  Provider Name/Title Barkley Boards, PA-C  Date Provider Notified 07/13/20  Time Provider Notified 1316  Notification Type Call  Notification Reason Other (Comment) (change in heart rate/rhythm pt asymptomatic )  Response See new orders  Date of Provider Response 07/13/20  Time of Provider Response 1316  Notify: Rapid Response  Name of Rapid Response RN Notified No  Document  Progress note created (see row info) Yes

## 2020-07-14 ENCOUNTER — Encounter (HOSPITAL_COMMUNITY): Payer: Self-pay

## 2020-07-14 DIAGNOSIS — I480 Paroxysmal atrial fibrillation: Secondary | ICD-10-CM | POA: Diagnosis not present

## 2020-07-14 LAB — BASIC METABOLIC PANEL
Anion gap: 7 (ref 5–15)
BUN: 12 mg/dL (ref 8–23)
CO2: 26 mmol/L (ref 22–32)
Calcium: 8.5 mg/dL — ABNORMAL LOW (ref 8.9–10.3)
Chloride: 106 mmol/L (ref 98–111)
Creatinine, Ser: 0.92 mg/dL (ref 0.61–1.24)
GFR, Estimated: >60 mL/min (ref >60)
Glucose, Bld: 107 mg/dL — ABNORMAL HIGH (ref 70–99)
Potassium: 3.9 mmol/L (ref 3.5–5.1)
Sodium: 139 mmol/L (ref 135–145)

## 2020-07-14 LAB — CBC
HCT: 21.6 % — ABNORMAL LOW (ref 39.0–52.0)
Hemoglobin: 7.1 g/dL — ABNORMAL LOW (ref 13.0–17.0)
MCH: 27.4 pg (ref 26.0–34.0)
MCHC: 32.9 g/dL (ref 30.0–36.0)
MCV: 83.4 fL (ref 80.0–100.0)
Platelets: 127 10*3/uL — ABNORMAL LOW (ref 150–400)
RBC: 2.59 MIL/uL — ABNORMAL LOW (ref 4.22–5.81)
RDW: 14.2 % (ref 11.5–15.5)
WBC: 8.1 10*3/uL (ref 4.0–10.5)
nRBC: 0 % (ref 0.0–0.2)

## 2020-07-14 LAB — PREPARE RBC (CROSSMATCH)

## 2020-07-14 LAB — HEMOGLOBIN AND HEMATOCRIT, BLOOD
HCT: 25.2 % — ABNORMAL LOW (ref 39.0–52.0)
Hemoglobin: 8.1 g/dL — ABNORMAL LOW (ref 13.0–17.0)

## 2020-07-14 MED ORDER — SODIUM CHLORIDE 0.9% IV SOLUTION: Freq: Once | INTRAVENOUS | Status: AC

## 2020-07-14 MED ORDER — METOPROLOL TARTRATE 25 MG PO TABS
25.0000 mg | ORAL_TABLET | Freq: Two times a day (BID) | ORAL | Status: DC
  Administered 2020-07-14 – 2020-07-17 (×6): 25 mg via ORAL
  Filled 2020-07-14 (×6): qty 1
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
## 2020-07-14 NOTE — Progress Notes (Signed)
Pt received 1 u PRBCs with no signs of reaction. H&H set to be drawn at 1710.   Justice Rocher, RN

## 2020-07-14 NOTE — Progress Notes (Signed)
Ortho Trauma Note  Patient seen and evaluated. Swelling is similar to yesterday. I feel that his skin is still too swollen to proceed with definitive ORIF. Will recommend discharge home with outpatient follow up. He can see me on Tuesday for a skin check and we will determine scheduling at that point.  Shona Needles, MD [facility] [phone] (office) [email or web address]
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
## 2020-07-14 NOTE — Discharge Instructions (Addendum)
[facility] Discharge Instructions   General Discharge Instructions  WEIGHT BEARING STATUS: Non-weightbearing right lower extremity  RANGE OF MOTION/ACTIVITY: Wiggle toes as tolerated. Knee motion as tolerated. Keep ankle wrapped with ace wrap until follow-up.   Wound Care: Change pin site dressings daily.   DVT/PE prophylaxis: Lovenox  Diet: as you were eating previously.  Can use over the counter stool softeners and bowel preparations, such as Miralax, to help with bowel movements.  Narcotics can be constipating.  Be sure to drink plenty of fluids  PAIN MEDICATION USE AND EXPECTATIONS  You have likely been given narcotic medications to help control your pain.  After a traumatic event that results in an fracture (broken bone) with or without surgery, it is ok to use narcotic pain medications to help control one's pain.  We understand that everyone responds to pain differently and each individual patient will be evaluated on a regular basis for the continued need for narcotic medications. Ideally, narcotic medication use should last no more than 6-8 weeks (coinciding with fracture healing).   As a patient it is your responsibility as well to monitor narcotic medication use and report the amount and frequency you use these medications when you come to your office visit.   We would also advise that if you are using narcotic medications, you should take a dose prior to therapy to maximize you participation.  IF YOU ARE ON NARCOTIC MEDICATIONS IT IS NOT PERMISSIBLE TO OPERATE A MOTOR VEHICLE (MOTORCYCLE/CAR/TRUCK/MOPED) OR HEAVY MACHINERY DO NOT MIX NARCOTICS WITH OTHER CNS (CENTRAL NERVOUS SYSTEM) DEPRESSANTS SUCH AS ALCOHOL   STOP SMOKING OR USING NICOTINE PRODUCTS!!!!  As discussed nicotine severely impairs your body's ability to heal surgical and traumatic wounds but also impairs bone healing.  Wounds and bone heal by forming microscopic blood vessels (angiogenesis) and  nicotine is a vasoconstrictor (essentially, shrinks blood vessels).  Therefore, if vasoconstriction occurs to these microscopic blood vessels they essentially disappear and are unable to deliver necessary nutrients to the healing tissue.  This is one modifiable factor that you can do to dramatically increase your chances of healing your injury.    (This means no smoking, no nicotine gum, patches, etc)  DO NOT USE NONSTEROIDAL ANTI-INFLAMMATORY DRUGS (NSAID'S)  Using products such as Advil (ibuprofen), Aleve (naproxen), Motrin (ibuprofen) for additional pain control during fracture healing can delay and/or prevent the healing response.  If you would like to take over the counter (OTC) medication, Tylenol (acetaminophen) is ok.  However, some narcotic medications that are given for pain control contain acetaminophen as well. Therefore, you should not exceed more than 4000 mg of tylenol in a day if you do not have liver disease.  Also note that there are may OTC medicines, such as cold medicines and allergy medicines that my contain tylenol as well.  If you have any questions about medications and/or interactions please ask your doctor/PA or your pharmacist.      ICE AND ELEVATE INJURED/OPERATIVE EXTREMITY  Using ice and elevating the injured extremity above your heart can help with swelling and pain control.  Icing in a pulsatile fashion, such as 20 minutes on and 20 minutes off, can be followed.    Do not place ice directly on skin. Make sure there is a barrier between to skin and the ice pack.    Using frozen items such as frozen peas works well as the conform nicely to the are that needs to be iced.  USE AN ACE WRAP  OR TED HOSE FOR SWELLING CONTROL  In addition to icing and elevation, Ace wraps or TED hose are used to help limit and resolve swelling.  It is recommended to use Ace wraps or TED hose until you are informed to stop.    When using Ace Wraps start the wrapping distally (farthest away from  the body) and wrap proximally (closer to the body)   Example: If you had surgery on your leg or thing and you do not have a splint on, start the ace wrap at the toes and work your way up to the thigh        If you had surgery on your upper extremity and do not have a splint on, start the ace wrap at your fingers and work your way up to the upper arm   [place]: [phone]   VISIT OUR WEBSITE FOR ADDITIONAL INFORMATION: [email or web address]      Discharge Pin Site Instructions  Dress pins daily with Kerlix roll starting on POD 2. Wrap the Kerlix so that it tamps the skin down around the pin-skin interface to prevent/limit motion of the skin relative to the pin.  (Pin-skin motion is the primary cause of pain and infection related to external fixator pin sites).  Remove any crust or coagulum that may obstruct drainage with a saline moistened gauze or soap and water.  After POD 3, if there is no discernable drainage on the pin site dressing, the interval for change can by increased to every other day.  You may shower with the fixator, cleaning all pin sites gently with soap and water.  If you have a surgical wound this needs to be completely dry and without drainage before showering.  The extremity can be lifted by the fixator to facilitate wound care and transfers.  Notify the office/Doctor if you experience increasing drainage, redness, or pain from a pin site, or if you notice purulent (thick, snot-like) drainage.   Rib Fracture  A rib fracture is a break or crack in one of the bones of the ribs. The ribs are like a cage that goes around your upper chest. A broken or cracked rib is often painful, but most do not cause other problems. Most rib fractures usually heal on their own in 1-3 months. Follow these instructions at home: Managing pain, stiffness, and swelling  If directed, apply ice to the injured area. ? Put ice in a plastic bag. ? Place  a towel between your skin and the bag. ? Leave the ice on for 20 minutes, 2-3 times a day.  Take over-the-counter and prescription medicines only as told by your doctor. Activity  Avoid activities that cause pain to the injured area. Protect your injured area.  Slowly increase activity as told by your doctor. General instructions  Do deep breathing as told by your doctor. You may be told to: ? Take deep breaths many times a day. ? Cough many times a day while hugging a pillow. ? Use a device (incentive spirometer) to do deep breathing many times a day.  Drink enough fluid to keep your pee (urine) clear or pale yellow.  Do not wear a rib belt or binder. These do not allow you to breathe deeply.  Keep all follow-up visits as told by your doctor. This is important. Contact a doctor if:  You have a fever. Get help right away if:  You have trouble breathing.  You are short of breath.  You cannot stop coughing.  You cough up thick or bloody spit (sputum).  You feel sick to your stomach (nauseous), throw up (vomit), or have belly (abdominal) pain.  Your pain gets worse and medicine does not help. Summary  A rib fracture is a break or crack in one of the bones of the ribs.  Apply ice to the injured area and take medicines for pain as told by your doctor.  Take deep breaths and cough many times a day. Hug a pillow every time you cough. This information is not intended to replace advice given to you by your health care provider. Make sure you discuss any questions you have with your health care provider. Document Revised: 08/16/2017 Document Reviewed: 12/04/2016 Elsevier Patient Education  [place], Adult  A concussion is a brain injury from a hard, direct hit (trauma) to your head or body. This direct hit causes the brain to quickly shake back and forth inside the skull. A concussion may also be called a mild traumatic brain injury (TBI). Healing from  this injury can take time. What are the causes? This condition is caused by:  A direct hit to your head, such as: ? Running into a player during a game. ? Being hit in a fight. ? Hitting your head on a hard surface.  A quick and sudden movement (jolt) of the head or neck, such as in a car crash. What are the signs or symptoms? The signs of a concussion can be hard to notice. They may be missed by you, family members, and doctors. You may look fine on the outside but may not act or feel normal. Physical symptoms  Headaches.  Being tired (fatigued).  Being dizzy.  Problems with body balance.  Problems seeing or hearing.  Being sensitive to light or noise.  Feeling sick to your stomach (nausea) or throwing up (vomiting).  Not sleeping or eating as you used to.  Loss of feeling (numbness) or tingling in the body.  Seizure. Mental and emotional symptoms  Problems remembering things.  Trouble focusing your mind (concentrating), organizing, or making decisions.  Being slow to think, act, react, speak, or read.  Feeling grouchy (irritable).  Having mood changes.  Feeling worried or nervous (anxious).  Feeling sad (depressed). How is this treated? This condition may be treated by:  Stopping sports or activity if you are injured. If you hit your head or have signs of concussion: ? Do not return to sports or activities the same day. ? Get checked by a doctor before you return to your activities.  Resting your body and your mind.  Being watched carefully, often at home.  Medicines to help with symptoms such as: ? Feeling sick to your stomach. ? Headaches. ? Problems with sleep.  Avoid taking strong pain medicines (opioids) for a concussion.  Avoiding alcohol and drugs.  Being asked to go to a concussion clinic or a place to help you recover (rehabilitation center). Recovery from a concussion can take time. Return to activities only:  When you are fully  healed.  When your doctor says it is safe. Follow these instructions at home: Activity  Limit activities that need a lot of thought or focus, such as: ? Homework or work for your job. ? Watching TV. ? Using the computer or phone. ? Playing memory games and puzzles.  Rest. Rest helps your brain heal. Make sure you: ? Get plenty of sleep. Most adults should get 7-9  hours of sleep each night. ? Rest during the day. Take naps or breaks when you feel tired.  Avoid activity like exercise until your doctor says its safe. Stop any activity that makes symptoms worse.  Do not do activities that could cause a second concussion, such as riding a bike or playing sports.  Ask your doctor when you can return to your normal activities, such as school, work, sports, and driving. Your ability to react may be slower. Do not do these activities if you are dizzy. General instructions   Take over-the-counter and prescription medicines only as told by your doctor.  Do not drink alcohol until your doctor says you can.  Watch your symptoms and tell other people to do the same. Other problems can occur after a concussion. Older adults have a higher risk of serious problems.  Tell your work Freight forwarder, teachers, Government social research officer, school counselor, coach, or Product/process development scientist about your injury and symptoms. Tell them about what you can or cannot do.  Keep all follow-up visits as told by your doctor. This is important. How is this prevented?  It is very important that you do not get another brain injury. In rare cases, another injury can cause brain damage that will not go away, brain swelling, or death. The risk of this is greatest in the first 7-10 days after a head injury. To avoid injuries: ? Stop activities that could lead to a second concussion, such as contact sports, until your doctor says it is okay. ? When you return to sports or activities:  Do not crash into other players. This is how most concussions  happen.  Follow the rules.  Respect other players. ? Get regular exercise. Do strength and balance training. ? Wear a helmet that fits you well during sports, biking, or other activities.  Helmets can help protect you from serious skull and brain injuries, but they do not protect you from a concussion. Even when wearing a helmet, you should avoid being hit in the head. Contact a doctor if:  Your symptoms get worse or they do not get better.  You have new symptoms.  You have another injury. Get help right away if:  You have bad headaches or your headaches get worse.  You feel weak or numb in any part of your body.  You are mixed up (confused).  Your balance gets worse.  You keep throwing up.  You feel more sleepy than normal.  Your speech is not clear (is slurred).  You cannot recognize people or places.  You have a seizure.  Others have trouble waking you up.  You have behavior changes.  You have changes in how you see (vision).  You pass out (lose consciousness). Summary  A concussion is a brain injury from a hard, direct hit (trauma) to your head or body.  This condition is treated with rest and careful watching of symptoms.  If you keep having symptoms, call your doctor. This information is not intended to replace advice given to you by your health care provider. Make sure you discuss any questions you have with your health care provider. Document Revised: 04/24/2018 Document Reviewed: 04/24/2018 Elsevier Patient Education  [place].

## 2020-07-14 NOTE — Progress Notes (Signed)
Page to trauma MD on call at 2123 and notified that patient has bloody streaks in stool, no s/sx of distress evident. Call received from MD at this time and MD states to obtain CBC in AM lab draw. Order already present in system. Will continue to monitor
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
## 2020-07-15 ENCOUNTER — Other Ambulatory Visit (HOSPITAL_COMMUNITY): Payer: Self-pay | Admitting: General Surgery

## 2020-07-15 ENCOUNTER — Inpatient Hospital Stay (HOSPITAL_COMMUNITY): Payer: Medicare HMO

## 2020-07-15 DIAGNOSIS — I480 Paroxysmal atrial fibrillation: Secondary | ICD-10-CM | POA: Diagnosis not present

## 2020-07-15 DIAGNOSIS — I351 Nonrheumatic aortic (valve) insufficiency: Secondary | ICD-10-CM | POA: Diagnosis not present

## 2020-07-15 LAB — BPAM RBC
Blood Product Expiration Date: 202111142359
Blood Product Expiration Date: 202111272359
ISSUE DATE / TIME: 202110250252
ISSUE DATE / TIME: 202110281155
Unit Type and Rh: 5100
Unit Type and Rh: 5100

## 2020-07-15 LAB — ECHOCARDIOGRAM COMPLETE
AR max vel: 3.03 cm2
AV Area VTI: 2.79 cm2
AV Area mean vel: 2.73 cm2
AV Mean grad: 6 mmHg
AV Peak grad: 11 mmHg
Ao pk vel: 1.66 m/s
Area-P 1/2: 2.6 cm2
Height: 64 in
P 1/2 time: 724 msec
S' Lateral: 2.7 cm
Weight: 2608 oz

## 2020-07-15 LAB — TYPE AND SCREEN
ABO/RH(D): O POS
Antibody Screen: NEGATIVE
Unit division: 0
Unit division: 0

## 2020-07-15 LAB — CBC
HCT: 22.5 % — ABNORMAL LOW (ref 39.0–52.0)
Hemoglobin: 7.3 g/dL — ABNORMAL LOW (ref 13.0–17.0)
MCH: 27 pg (ref 26.0–34.0)
MCHC: 32.4 g/dL (ref 30.0–36.0)
MCV: 83.3 fL (ref 80.0–100.0)
Platelets: 144 10*3/uL — ABNORMAL LOW (ref 150–400)
RBC: 2.7 MIL/uL — ABNORMAL LOW (ref 4.22–5.81)
RDW: 14.6 % (ref 11.5–15.5)
WBC: 7.1 10*3/uL (ref 4.0–10.5)
nRBC: 0.7 % — ABNORMAL HIGH (ref 0.0–0.2)

## 2020-07-15 LAB — BASIC METABOLIC PANEL
Anion gap: 7 (ref 5–15)
BUN: 14 mg/dL (ref 8–23)
CO2: 28 mmol/L (ref 22–32)
Calcium: 8.6 mg/dL — ABNORMAL LOW (ref 8.9–10.3)
Chloride: 104 mmol/L (ref 98–111)
Creatinine, Ser: 0.95 mg/dL (ref 0.61–1.24)
GFR, Estimated: >60 mL/min (ref >60)
Glucose, Bld: 92 mg/dL (ref 70–99)
Potassium: 3.8 mmol/L (ref 3.5–5.1)
Sodium: 139 mmol/L (ref 135–145)

## 2020-07-15 LAB — TSH: TSH: 3.556 u[IU]/mL (ref 0.350–4.500)

## 2020-07-15 LAB — MAGNESIUM: Magnesium: 1.6 mg/dL — ABNORMAL LOW (ref 1.7–2.4)

## 2020-07-15 MED ORDER — ACETAMINOPHEN 325 MG PO TABS: 650.0000 mg | ORAL_TABLET | Freq: Four times a day (QID) | ORAL | Status: AC | PRN

## 2020-07-15 MED ORDER — METHOCARBAMOL 500 MG PO TABS: 500.0000 mg | ORAL_TABLET | Freq: Four times a day (QID) | ORAL | 0 refills | Status: DC | PRN

## 2020-07-15 MED ORDER — VITAMIN D3 25 MCG PO TABS
2000.0000 [IU] | ORAL_TABLET | Freq: Every day | ORAL | Status: DC
Start: 2020-07-16 — End: 2020-11-11

## 2020-07-15 MED ORDER — METOPROLOL TARTRATE 25 MG PO TABS
25.0000 mg | ORAL_TABLET | Freq: Two times a day (BID) | ORAL | 0 refills | Status: DC
Start: 2020-07-15 — End: 2020-11-11

## 2020-07-15 MED ORDER — POLYETHYLENE GLYCOL 3350 17 G PO PACK: 17.0000 g | PACK | Freq: Every day | ORAL | 0 refills | Status: DC | PRN

## 2020-07-15 MED ORDER — GABAPENTIN 100 MG PO CAPS
100.0000 mg | ORAL_CAPSULE | Freq: Three times a day (TID) | ORAL | 0 refills | Status: DC | PRN
Start: 2020-07-15 — End: 2020-11-11

## 2020-07-15 MED ORDER — OXYCODONE HCL 5 MG PO TABS: 5.0000 mg | ORAL_TABLET | ORAL | 0 refills | Status: DC | PRN
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
## 2020-07-15 NOTE — Progress Notes (Signed)
  Echocardiogram 2D Echocardiogram has been performed.  Dillon Tran 07/15/2020, 1:44 PM

## 2020-07-15 NOTE — Procedures (Signed)
Echo attempted. Patient in chair. Unable to reach nurse at [phone] to transfer patient to bed. Will attempt again later.

## 2020-07-15 NOTE — Progress Notes (Signed)
SpO2 decreasing to 78% intermittently while pt is asleep and snoring. Oxygen administered at 2 LPM via [place]. Pt denies history of sleep apnea. A&O x4, no s/sx of distress evident at this time.
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
## 2020-07-16 LAB — CANCER ANTIGEN 19-9: CA 19-9: 9 U/mL (ref 0–35)
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
## 2020-07-16 NOTE — Progress Notes (Signed)
2D echo reviewed IMPRESSIONS    1. Left ventricular ejection fraction, by estimation, is 60 to 65%. The  left ventricle has normal function. The left ventricle has no regional  wall motion abnormalities. There is severe concentric left ventricular  hypertrophy. Left ventricular diastolic  parameters are consistent with Grade I diastolic dysfunction (impaired  relaxation).  2. Right ventricular systolic function is normal. The right ventricular  size is normal. Tricuspid regurgitation signal is inadequate for assessing  PA pressure.  3. A small pericardial effusion is present. The pericardial effusion is  posterior to the left ventricle. There is no evidence of cardiac  tamponade.  4. The mitral valve is grossly normal. Trivial mitral valve  regurgitation. No evidence of mitral stenosis.  5. The aortic valve is tricuspid. There is moderate calcification of the  aortic valve. There is moderate thickening of the aortic valve. Aortic  valve regurgitation is mild to moderate. Mild aortic valve sclerosis is  present, with no evidence of aortic  valve stenosis.  6. The inferior vena cava is normal in size with greater than 50%  respiratory variability, suggesting right atrial pressure of 3 mmHg.   Continue Lopressor for suppression of PACs and PAF.
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
## 2020-07-17 NOTE — Progress Notes (Signed)
Dc instructions given to pat and daughter at the bedside. TOC meds given to pt. IVs removed. Pt taken to vehicle via wc by NT.

## 2020-07-18 ENCOUNTER — Other Ambulatory Visit: Payer: Self-pay | Admitting: Obstetrics and Gynecology

## 2020-07-18 ENCOUNTER — Other Ambulatory Visit: Payer: Self-pay | Admitting: Surgery

## 2020-07-18 DIAGNOSIS — K8689 Other specified diseases of pancreas: Secondary | ICD-10-CM

## 2020-07-19 ENCOUNTER — Ambulatory Visit: Payer: Self-pay | Admitting: Student

## 2020-07-19 DIAGNOSIS — S82871A Displaced pilon fracture of right tibia, initial encounter for closed fracture: Secondary | ICD-10-CM
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
## 2020-07-21 ENCOUNTER — Other Ambulatory Visit: Payer: Self-pay

## 2020-07-21 ENCOUNTER — Encounter (HOSPITAL_COMMUNITY): Payer: Self-pay | Admitting: Student

## 2020-07-21 NOTE — Progress Notes (Addendum)
Spoke with pt's daughter, Carilyn Goodpasture for pre-op call. Pt was in the room with Collete while we were talking. Pt denies any cardiac history. Pt is treated for HTN. No history of Diabetes.   Covid test to be done day of surgery due to transportation issues.  Collette will be calling Dr. Doreatha Martin' office to ask about stopping Aspirin.

## 2020-07-22 ENCOUNTER — Observation Stay (HOSPITAL_COMMUNITY)
Admission: AD | Admit: 2020-07-22 | Discharge: 2020-07-23 | Disposition: A | Discharge status: Home / Self Care | Payer: Medicare HMO | Attending: Student | Admitting: Student

## 2020-07-22 ENCOUNTER — Ambulatory Visit (HOSPITAL_COMMUNITY): Payer: Medicare HMO | Admitting: Certified Registered Nurse Anesthetist

## 2020-07-22 ENCOUNTER — Inpatient Hospital Stay (HOSPITAL_COMMUNITY): Payer: Medicare HMO

## 2020-07-22 ENCOUNTER — Other Ambulatory Visit (HOSPITAL_COMMUNITY): Payer: Self-pay | Admitting: Student

## 2020-07-22 ENCOUNTER — Encounter (HOSPITAL_COMMUNITY)
Admission: AD | Disposition: A | Discharge status: Home / Self Care | Payer: Self-pay | Source: Home / Self Care | Attending: Student

## 2020-07-22 ENCOUNTER — Encounter (HOSPITAL_COMMUNITY): Payer: Self-pay | Admitting: Student

## 2020-07-22 DIAGNOSIS — I10 Essential (primary) hypertension: Secondary | ICD-10-CM | POA: Insufficient documentation

## 2020-07-22 DIAGNOSIS — Z20822 Contact with and (suspected) exposure to covid-19: Secondary | ICD-10-CM | POA: Diagnosis not present

## 2020-07-22 DIAGNOSIS — M6281 Muscle weakness (generalized): Secondary | ICD-10-CM | POA: Diagnosis not present

## 2020-07-22 DIAGNOSIS — R2681 Unsteadiness on feet: Secondary | ICD-10-CM | POA: Diagnosis not present

## 2020-07-22 DIAGNOSIS — Z419 Encounter for procedure for purposes other than remedying health state, unspecified: Secondary | ICD-10-CM

## 2020-07-22 DIAGNOSIS — S99911A Unspecified injury of right ankle, initial encounter: Secondary | ICD-10-CM | POA: Diagnosis present

## 2020-07-22 DIAGNOSIS — S82871A Displaced pilon fracture of right tibia, initial encounter for closed fracture: Principal | ICD-10-CM | POA: Insufficient documentation

## 2020-07-22 DIAGNOSIS — Z79899 Other long term (current) drug therapy: Secondary | ICD-10-CM | POA: Diagnosis not present

## 2020-07-22 DIAGNOSIS — T148XXA Other injury of unspecified body region, initial encounter: Secondary | ICD-10-CM

## 2020-07-22 DIAGNOSIS — Z8546 Personal history of malignant neoplasm of prostate: Secondary | ICD-10-CM | POA: Insufficient documentation

## 2020-07-22 HISTORY — DX: Anemia, unspecified: D64.9

## 2020-07-22 HISTORY — PX: EXTERNAL FIXATION REMOVAL: SHX5040

## 2020-07-22 HISTORY — PX: OPEN REDUCTION INTERNAL FIXATION (ORIF) TIBIA/FIBULA FRACTURE: SHX5992

## 2020-07-22 HISTORY — DX: Personal history of urinary calculi: Z87.442

## 2020-07-22 LAB — CREATININE, SERUM
Creatinine, Ser: 1 mg/dL (ref 0.61–1.24)
GFR, Estimated: >60 mL/min (ref >60)

## 2020-07-22 LAB — CBC
HCT: 25 % — ABNORMAL LOW (ref 39.0–52.0)
Hemoglobin: 7.7 g/dL — ABNORMAL LOW (ref 13.0–17.0)
MCH: 26.4 pg (ref 26.0–34.0)
MCHC: 30.8 g/dL (ref 30.0–36.0)
MCV: 85.6 fL (ref 80.0–100.0)
Platelets: 339 10*3/uL (ref 150–400)
RBC: 2.92 MIL/uL — ABNORMAL LOW (ref 4.22–5.81)
RDW: 15.6 % — ABNORMAL HIGH (ref 11.5–15.5)
WBC: 10.1 10*3/uL (ref 4.0–10.5)
nRBC: 0 % (ref 0.0–0.2)

## 2020-07-22 LAB — SARS CORONAVIRUS 2 BY RT PCR (HOSPITAL ORDER, PERFORMED IN ~~LOC~~ HOSPITAL LAB): SARS Coronavirus 2: NEGATIVE

## 2020-07-22 LAB — SURGICAL PCR SCREEN
MRSA, PCR: NEGATIVE
Staphylococcus aureus: NEGATIVE

## 2020-07-22 SURGERY — OPEN REDUCTION INTERNAL FIXATION (ORIF) TIBIA/FIBULA FRACTURE
Anesthesia: Monitor Anesthesia Care | Site: Leg Lower | Laterality: Right

## 2020-07-22 MED ORDER — LIDOCAINE 2% (20 MG/ML) 5 ML SYRINGE
INTRAMUSCULAR | Status: AC
  Filled 2020-07-22: qty 5

## 2020-07-22 MED ORDER — DOXYCYCLINE MONOHYDRATE 100 MG PO TABS: 100.0000 mg | ORAL_TABLET | Freq: Two times a day (BID) | ORAL | 0 refills | Status: AC

## 2020-07-22 MED ORDER — ONDANSETRON HCL 4 MG/2ML IJ SOLN
INTRAMUSCULAR | Status: AC
  Filled 2020-07-22: qty 2

## 2020-07-22 MED ORDER — METOCLOPRAMIDE HCL 5 MG/ML IJ SOLN: 5.0000 mg | Freq: Three times a day (TID) | INTRAMUSCULAR | Status: DC | PRN

## 2020-07-22 MED ORDER — ONDANSETRON HCL 4 MG/2ML IJ SOLN: 4.0000 mg | Freq: Four times a day (QID) | INTRAMUSCULAR | Status: DC | PRN

## 2020-07-22 MED ORDER — FENTANYL CITRATE (PF) 250 MCG/5ML IJ SOLN
INTRAMUSCULAR | Status: AC
  Filled 2020-07-22: qty 5

## 2020-07-22 MED ORDER — CEFAZOLIN SODIUM-DEXTROSE 2-4 GM/100ML-% IV SOLN
2.0000 g | Freq: Three times a day (TID) | INTRAVENOUS | Status: AC
  Administered 2020-07-22 – 2020-07-23 (×3): 2 g via INTRAVENOUS
  Filled 2020-07-22 (×3): qty 100

## 2020-07-22 MED ORDER — TAMSULOSIN HCL 0.4 MG PO CAPS
0.4000 mg | ORAL_CAPSULE | Freq: Every day | ORAL | Status: DC
  Administered 2020-07-23: 0.4 mg via ORAL
  Filled 2020-07-22 (×2): qty 1

## 2020-07-22 MED ORDER — ASPIRIN EC 81 MG PO TBEC
81.0000 mg | DELAYED_RELEASE_TABLET | Freq: Every day | ORAL | Status: DC
  Administered 2020-07-22 – 2020-07-23 (×2): 81 mg via ORAL
  Filled 2020-07-22 (×2): qty 1

## 2020-07-22 MED ORDER — PHENYLEPHRINE HCL-NACL 10-0.9 MG/250ML-% IV SOLN
INTRAVENOUS | Status: DC | PRN
  Administered 2020-07-22: 80 ug/min via INTRAVENOUS

## 2020-07-22 MED ORDER — ORAL CARE MOUTH RINSE: 15.0000 mL | Freq: Once | OROMUCOSAL | Status: AC

## 2020-07-22 MED ORDER — CEFAZOLIN SODIUM-DEXTROSE 2-4 GM/100ML-% IV SOLN
INTRAVENOUS | Status: AC
  Filled 2020-07-22: qty 100

## 2020-07-22 MED ORDER — POLYETHYLENE GLYCOL 3350 17 G PO PACK: 17.0000 g | PACK | Freq: Every day | ORAL | Status: DC | PRN

## 2020-07-22 MED ORDER — VANCOMYCIN HCL 1000 MG IV SOLR
INTRAVENOUS | Status: AC
  Filled 2020-07-22: qty 1000

## 2020-07-22 MED ORDER — MIDAZOLAM HCL 2 MG/2ML IJ SOLN
INTRAMUSCULAR | Status: AC
  Administered 2020-07-22: 1 mg via INTRAVENOUS
  Filled 2020-07-22: qty 2

## 2020-07-22 MED ORDER — VANCOMYCIN HCL 1000 MG IV SOLR
INTRAVENOUS | Status: DC | PRN
  Administered 2020-07-22: 1000 mg

## 2020-07-22 MED ORDER — HYDROMORPHONE HCL 1 MG/ML IJ SOLN: 0.2500 mg | INTRAMUSCULAR | Status: DC | PRN

## 2020-07-22 MED ORDER — DOCUSATE SODIUM 100 MG PO CAPS
100.0000 mg | ORAL_CAPSULE | Freq: Two times a day (BID) | ORAL | Status: DC
  Administered 2020-07-22 – 2020-07-23 (×3): 100 mg via ORAL
  Filled 2020-07-22 (×3): qty 1

## 2020-07-22 MED ORDER — MIDAZOLAM HCL 2 MG/2ML IJ SOLN: 1.0000 mg | Freq: Once | INTRAMUSCULAR | Status: AC

## 2020-07-22 MED ORDER — PROPOFOL 1000 MG/100ML IV EMUL
INTRAVENOUS | Status: AC
  Filled 2020-07-22: qty 100

## 2020-07-22 MED ORDER — OXYCODONE HCL 5 MG PO TABS
5.0000 mg | ORAL_TABLET | ORAL | Status: DC | PRN
  Administered 2020-07-22 – 2020-07-23 (×4): 5 mg via ORAL
  Filled 2020-07-22 (×4): qty 1

## 2020-07-22 MED ORDER — BUPIVACAINE-EPINEPHRINE (PF) 0.5% -1:200000 IJ SOLN
INTRAMUSCULAR | Status: DC | PRN
  Administered 2020-07-22: 30 mL via PERINEURAL

## 2020-07-22 MED ORDER — OXYCODONE HCL 5 MG PO TABS: 5.0000 mg | ORAL_TABLET | ORAL | 0 refills | Status: DC | PRN

## 2020-07-22 MED ORDER — ONDANSETRON HCL 4 MG/2ML IJ SOLN: 4.0000 mg | Freq: Once | INTRAMUSCULAR | Status: DC | PRN

## 2020-07-22 MED ORDER — ACETAMINOPHEN 325 MG PO TABS
650.0000 mg | ORAL_TABLET | Freq: Four times a day (QID) | ORAL | Status: DC
  Administered 2020-07-22 – 2020-07-23 (×4): 650 mg via ORAL
  Filled 2020-07-22 (×4): qty 2

## 2020-07-22 MED ORDER — METOPROLOL TARTRATE 25 MG PO TABS
25.0000 mg | ORAL_TABLET | Freq: Two times a day (BID) | ORAL | Status: DC
  Administered 2020-07-22 – 2020-07-23 (×2): 25 mg via ORAL
  Filled 2020-07-22 (×3): qty 1

## 2020-07-22 MED ORDER — CHLORHEXIDINE GLUCONATE 0.12 % MT SOLN
OROMUCOSAL | Status: AC
  Administered 2020-07-22: 15 mL via OROMUCOSAL
  Filled 2020-07-22: qty 15

## 2020-07-22 MED ORDER — METHOCARBAMOL 1000 MG/10ML IJ SOLN
500.0000 mg | Freq: Four times a day (QID) | INTRAVENOUS | Status: DC | PRN
  Filled 2020-07-22: qty 5

## 2020-07-22 MED ORDER — TOBRAMYCIN SULFATE 1.2 G IJ SOLR
INTRAMUSCULAR | Status: DC | PRN
  Administered 2020-07-22: 1.2 g

## 2020-07-22 MED ORDER — METHOCARBAMOL 500 MG PO TABS: 500.0000 mg | ORAL_TABLET | Freq: Four times a day (QID) | ORAL | Status: DC | PRN

## 2020-07-22 MED ORDER — MIDAZOLAM HCL 2 MG/2ML IJ SOLN
INTRAMUSCULAR | Status: DC | PRN
  Administered 2020-07-22 (×2): 1 mg via INTRAVENOUS

## 2020-07-22 MED ORDER — FENTANYL CITRATE (PF) 100 MCG/2ML IJ SOLN
INTRAMUSCULAR | Status: AC
  Administered 2020-07-22: 50 ug
  Filled 2020-07-22: qty 2

## 2020-07-22 MED ORDER — DEXAMETHASONE SODIUM PHOSPHATE 10 MG/ML IJ SOLN
INTRAMUSCULAR | Status: AC
  Filled 2020-07-22: qty 1

## 2020-07-22 MED ORDER — ONDANSETRON HCL 4 MG/2ML IJ SOLN
INTRAMUSCULAR | Status: DC | PRN
  Administered 2020-07-22: 4 mg via INTRAVENOUS

## 2020-07-22 MED ORDER — PROPOFOL 500 MG/50ML IV EMUL
INTRAVENOUS | Status: DC | PRN
  Administered 2020-07-22: 30 ug/kg/min via INTRAVENOUS

## 2020-07-22 MED ORDER — HYDROMORPHONE HCL 1 MG/ML IJ SOLN: 0.5000 mg | INTRAMUSCULAR | Status: DC | PRN

## 2020-07-22 MED ORDER — AMLODIPINE BESYLATE 5 MG PO TABS
5.0000 mg | ORAL_TABLET | Freq: Every day | ORAL | Status: DC
  Administered 2020-07-23: 5 mg via ORAL
  Filled 2020-07-22 (×2): qty 1

## 2020-07-22 MED ORDER — CHLORHEXIDINE GLUCONATE 0.12 % MT SOLN: 15.0000 mL | Freq: Once | OROMUCOSAL | Status: AC

## 2020-07-22 MED ORDER — DEXAMETHASONE SODIUM PHOSPHATE 10 MG/ML IJ SOLN
INTRAMUSCULAR | Status: DC | PRN
  Administered 2020-07-22: 5 mg via INTRAVENOUS

## 2020-07-22 MED ORDER — LACTATED RINGERS IV SOLN: INTRAVENOUS | Status: DC

## 2020-07-22 MED ORDER — LIDOCAINE-EPINEPHRINE (PF) 1.5 %-1:200000 IJ SOLN
INTRAMUSCULAR | Status: DC | PRN
  Administered 2020-07-22: 30 mL via PERINEURAL

## 2020-07-22 MED ORDER — POTASSIUM CHLORIDE IN NACL 20-0.9 MEQ/L-% IV SOLN
INTRAVENOUS | Status: DC
  Filled 2020-07-22 (×2): qty 1000

## 2020-07-22 MED ORDER — METOCLOPRAMIDE HCL 5 MG PO TABS: 5.0000 mg | ORAL_TABLET | Freq: Three times a day (TID) | ORAL | Status: DC | PRN

## 2020-07-22 MED ORDER — PROPOFOL 10 MG/ML IV BOLUS
INTRAVENOUS | Status: AC
  Filled 2020-07-22: qty 20

## 2020-07-22 MED ORDER — LIDOCAINE 2% (20 MG/ML) 5 ML SYRINGE
INTRAMUSCULAR | Status: DC | PRN
  Administered 2020-07-22: 20 mg via INTRAVENOUS

## 2020-07-22 MED ORDER — MIDAZOLAM HCL 2 MG/2ML IJ SOLN
INTRAMUSCULAR | Status: AC
  Filled 2020-07-22: qty 2

## 2020-07-22 MED ORDER — 0.9 % SODIUM CHLORIDE (POUR BTL) OPTIME
TOPICAL | Status: DC | PRN
  Administered 2020-07-22: 1000 mL

## 2020-07-22 MED ORDER — MEPERIDINE HCL 25 MG/ML IJ SOLN: 6.2500 mg | INTRAMUSCULAR | Status: DC | PRN

## 2020-07-22 MED ORDER — ALBUMIN HUMAN 5 % IV SOLN: INTRAVENOUS | Status: DC | PRN

## 2020-07-22 MED ORDER — GABAPENTIN 100 MG PO CAPS
100.0000 mg | ORAL_CAPSULE | Freq: Three times a day (TID) | ORAL | Status: DC
  Administered 2020-07-22 – 2020-07-23 (×4): 100 mg via ORAL
  Filled 2020-07-22 (×4): qty 1

## 2020-07-22 MED ORDER — ENOXAPARIN SODIUM 40 MG/0.4ML ~~LOC~~ SOLN
40.0000 mg | SUBCUTANEOUS | Status: DC
  Administered 2020-07-23: 40 mg via SUBCUTANEOUS
  Filled 2020-07-22: qty 0.4

## 2020-07-22 MED ORDER — TOBRAMYCIN SULFATE 1.2 G IJ SOLR
INTRAMUSCULAR | Status: AC
  Filled 2020-07-22: qty 1.2

## 2020-07-22 MED ORDER — ONDANSETRON HCL 4 MG PO TABS: 4.0000 mg | ORAL_TABLET | Freq: Four times a day (QID) | ORAL | Status: DC | PRN

## 2020-07-22 MED ORDER — ENOXAPARIN SODIUM 40 MG/0.4ML ~~LOC~~ SOLN: 40.0000 mg | SUBCUTANEOUS | 0 refills | Status: DC

## 2020-07-22 MED ORDER — CEFAZOLIN SODIUM-DEXTROSE 2-4 GM/100ML-% IV SOLN
2.0000 g | INTRAVENOUS | Status: AC
  Administered 2020-07-22: 2 g via INTRAVENOUS

## 2020-07-22 MED ORDER — VITAMIN D 25 MCG (1000 UNIT) PO TABS
2000.0000 [IU] | ORAL_TABLET | Freq: Every day | ORAL | Status: DC
  Administered 2020-07-22 – 2020-07-23 (×2): 2000 [IU] via ORAL
  Filled 2020-07-22 (×5): qty 2

## 2020-07-22 MED ORDER — ACETAMINOPHEN 325 MG PO TABS: 325.0000 mg | ORAL_TABLET | Freq: Four times a day (QID) | ORAL | Status: DC | PRN

## 2020-07-22 SURGICAL SUPPLY — 82 items
APL PRP STRL LF DISP 70% ISPRP (MISCELLANEOUS) ×2
BANDAGE ESMARK 6X9 LF (GAUZE/BANDAGES/DRESSINGS) ×2 IMPLANT
BIT DRILL 2.5X2.75 QC CALB (BIT) ×3 IMPLANT
BIT DRILL CALIBRATED 2.7 (BIT) ×3 IMPLANT
BNDG CMPR 9X6 STRL LF SNTH (GAUZE/BANDAGES/DRESSINGS) ×2
BNDG COHESIVE 4X5 TAN STRL (GAUZE/BANDAGES/DRESSINGS) ×3 IMPLANT
BNDG ELASTIC 4X5.8 VLCR STR LF (GAUZE/BANDAGES/DRESSINGS) ×3 IMPLANT
BNDG ELASTIC 6X5.8 VLCR STR LF (GAUZE/BANDAGES/DRESSINGS) ×3 IMPLANT
BNDG ESMARK 6X9 LF (GAUZE/BANDAGES/DRESSINGS) ×3
BNDG GAUZE ELAST 4 BULKY (GAUZE/BANDAGES/DRESSINGS) ×6 IMPLANT
BRUSH SCRUB EZ PLAIN DRY (MISCELLANEOUS) ×6 IMPLANT
CHLORAPREP W/TINT 26 (MISCELLANEOUS) ×3 IMPLANT
COVER SURGICAL LIGHT HANDLE (MISCELLANEOUS) ×6 IMPLANT
COVER WAND RF STERILE (DRAPES) ×3 IMPLANT
DRAPE C-ARM 42X72 X-RAY (DRAPES) ×3 IMPLANT
DRAPE C-ARMOR (DRAPES) ×3 IMPLANT
DRAPE ORTHO SPLIT 77X108 STRL (DRAPES) ×6
DRAPE SURG ORHT 6 SPLT 77X108 (DRAPES) ×4 IMPLANT
DRAPE U-SHAPE 47X51 STRL (DRAPES) ×3 IMPLANT
DRSG ADAPTIC 3X8 NADH LF (GAUZE/BANDAGES/DRESSINGS) ×3 IMPLANT
DRSG MEPITEL 4X7.2 (GAUZE/BANDAGES/DRESSINGS) ×3 IMPLANT
ELECT REM PT RETURN 9FT ADLT (ELECTROSURGICAL) ×3
ELECTRODE REM PT RTRN 9FT ADLT (ELECTROSURGICAL) ×2 IMPLANT
GAUZE SPONGE 4X4 12PLY STRL (GAUZE/BANDAGES/DRESSINGS) ×3 IMPLANT
GAUZE SPONGE 4X4 12PLY STRL LF (GAUZE/BANDAGES/DRESSINGS) ×3 IMPLANT
GLOVE BIO SURGEON STRL SZ 6.5 (GLOVE) ×9 IMPLANT
GLOVE BIO SURGEON STRL SZ7.5 (GLOVE) ×12 IMPLANT
GLOVE BIOGEL PI IND STRL 6.5 (GLOVE) ×2 IMPLANT
GLOVE BIOGEL PI IND STRL 7.5 (GLOVE) ×2 IMPLANT
GLOVE BIOGEL PI INDICATOR 6.5 (GLOVE) ×1
GLOVE BIOGEL PI INDICATOR 7.5 (GLOVE) ×1
GOWN STRL REUS W/ TWL LRG LVL3 (GOWN DISPOSABLE) ×4 IMPLANT
GOWN STRL REUS W/TWL LRG LVL3 (GOWN DISPOSABLE) ×6
K-WIRE ACE 1.6X6 (WIRE) ×9
KIT BASIN OR (CUSTOM PROCEDURE TRAY) ×3 IMPLANT
KIT TURNOVER KIT B (KITS) ×3 IMPLANT
KWIRE ACE 1.6X6 (WIRE) ×6 IMPLANT
MANIFOLD NEPTUNE II (INSTRUMENTS) ×3 IMPLANT
NEEDLE HYPO 21X1.5 SAFETY (NEEDLE) IMPLANT
NEEDLE HYPO 25GX1X1/2 BEV (NEEDLE) ×3 IMPLANT
NS IRRIG 1000ML POUR BTL (IV SOLUTION) ×3 IMPLANT
PACK TOTAL JOINT (CUSTOM PROCEDURE TRAY) ×3 IMPLANT
PAD ABD 8X10 STRL (GAUZE/BANDAGES/DRESSINGS) ×6 IMPLANT
PAD ARMBOARD 7.5X6 YLW CONV (MISCELLANEOUS) ×6 IMPLANT
PAD CAST 4YDX4 CTTN HI CHSV (CAST SUPPLIES) ×2 IMPLANT
PADDING CAST COTTON 4X4 STRL (CAST SUPPLIES) ×3
PADDING CAST COTTON 6X4 STRL (CAST SUPPLIES) ×3 IMPLANT
PLATE 9H RT DIST ANTLAT TIB (Plate) ×3 IMPLANT
PLATE ANTLAT CNTR NAR 156X9 (Plate) ×2 IMPLANT
PLATE COMPOSITE 6HOLE (Plate) ×3 IMPLANT
SCREW CORTICAL 3.5MM  28MM (Screw) ×2 IMPLANT
SCREW CORTICAL 3.5MM  30MM (Screw) ×3 IMPLANT
SCREW CORTICAL 3.5MM 28MM (Screw) ×4 IMPLANT
SCREW CORTICAL 3.5MM 30MM (Screw) ×6 IMPLANT
SCREW CORTICAL LOW PROF 3.5X32 (Screw) ×3 IMPLANT
SCREW CORTICAL LOW PROF 3.5X34 (Screw) ×3 IMPLANT
SCREW LOCK CORT STAR 3.5X10 (Screw) ×3 IMPLANT
SCREW LOCK CORT STAR 3.5X18 (Screw) ×3 IMPLANT
SCREW LOW PROFILE 3.5X30MM TIS (Screw) ×3 IMPLANT
SCREW T15 LP CORT 3.5X44MM NS (Screw) ×3 IMPLANT
SCREW T15 MD 3.5X44MM NS (Screw) ×3 IMPLANT
SCREW T15 MD 3.5X48MM NS (Screw) ×3 IMPLANT
SPLINT PLASTER CAST XFAST 5X30 (CAST SUPPLIES) ×4 IMPLANT
SPLINT PLASTER XFAST SET 5X30 (CAST SUPPLIES) ×2
SPONGE LAP 18X18 RF (DISPOSABLE) ×3 IMPLANT
STAPLER VISISTAT 35W (STAPLE) ×3 IMPLANT
SUCTION FRAZIER HANDLE 10FR (MISCELLANEOUS) ×1
SUCTION TUBE FRAZIER 10FR DISP (MISCELLANEOUS) ×2 IMPLANT
SUT ETHILON 3 0 PS 1 (SUTURE) ×6 IMPLANT
SUT MNCRL AB 3-0 PS2 18 (SUTURE) ×3 IMPLANT
SUT MON AB 2-0 CT1 36 (SUTURE) ×3 IMPLANT
SUT PROLENE 0 CT (SUTURE) IMPLANT
SUT VIC AB 0 CT1 27 (SUTURE) ×3
SUT VIC AB 0 CT1 27XBRD ANBCTR (SUTURE) ×2 IMPLANT
SUT VIC AB 2-0 CT1 18 (SUTURE) ×3 IMPLANT
SUT VIC AB 2-0 CT1 27 (SUTURE) ×6
SUT VIC AB 2-0 CT1 TAPERPNT 27 (SUTURE) ×4 IMPLANT
SYR CONTROL 10ML LL (SYRINGE) ×3 IMPLANT
TOWEL GREEN STERILE (TOWEL DISPOSABLE) ×6 IMPLANT
TOWEL GREEN STERILE FF (TOWEL DISPOSABLE) ×6 IMPLANT
UNDERPAD 30X36 HEAVY ABSORB (UNDERPADS AND DIAPERS) ×3 IMPLANT
WATER STERILE IRR 1000ML POUR (IV SOLUTION) ×6 IMPLANT

## 2020-07-22 NOTE — Plan of Care (Signed)
Educated Pt's daughter on giving Lovenox shot.   Problem: Clinical Measurements: Goal: Ability to maintain clinical measurements within normal limits will improve Outcome: Progressing   Problem: Coping: Goal: Level of anxiety will decrease Outcome: Progressing   Problem: Elimination: Goal: Will not experience complications related to bowel motility Outcome: Progressing   Problem: Safety: Goal: Ability to remain free from injury will improve Outcome: Progressing   Problem: Skin Integrity: Goal: Risk for impaired skin integrity will decrease Outcome: Progressing

## 2020-07-22 NOTE — Anesthesia Procedure Notes (Signed)
Anesthesia Regional Block: Popliteal block   Pre-Anesthetic Checklist: ,, timeout performed, Correct Patient, Correct Site, Correct Laterality, Correct Procedure, Correct Position, site marked, Risks and benefits discussed,  Surgical consent,  Pre-op evaluation,  At surgeon's request and post-op pain management  Laterality: Right  Prep: chloraprep       Needles:  Injection technique: Single-shot  Needle Type: Echogenic Stimulator Needle     Needle Length: 10cm  Needle Gauge: 21     Additional Needles:   Procedures:, nerve stimulator,,,,,,,   Nerve Stimulator or Paresthesia:  Response: 0.4 mA,   Additional Responses:   Narrative:  Start time: 07/22/2020 8:29 AM End time: 07/22/2020 8:39 AM Injection made incrementally with aspirations every 5 mL.  Performed by: Personally  Anesthesiologist: Lillia Abed, MD  Additional Notes: Monitors applied. Patient sedated. Sterile prep and drape,hand hygiene and sterile gloves were used. Relevant anatomy identified.Needle position confirmed.Local anesthetic injected incrementally after negative aspiration. Local anesthetic spread visualized around nerve(s). Vascular puncture avoided. No complications. Image printed for medical record.The patient tolerated the procedure well.  Additional Saphenous nerve block performed. 15cc Local Anesthetic mixture placed under ultrasonic guidance along the medio-inferior border of the Sartorious muscle 6 inches above the knee.  No Problems encountered.  Lillia Abed MD

## 2020-07-22 NOTE — Anesthesia Procedure Notes (Signed)
Procedure Name: MAC Date/Time: 07/22/2020 9:30 AM Performed by: Michele Rockers, CRNA Pre-anesthesia Checklist: Patient identified, Emergency Drugs available, Suction available, Timeout performed and Patient being monitored Patient Re-evaluated:Patient Re-evaluated prior to induction Oxygen Delivery Method: Simple face mask

## 2020-07-22 NOTE — Anesthesia Preprocedure Evaluation (Signed)
Anesthesia Evaluation  Patient identified by MRN, date of birth, ID band Patient awake    Reviewed: Allergy & Precautions, NPO status , Patient's Chart, lab work & pertinent test results  Airway Mallampati: I  TM Distance: >3 FB Neck ROM: Full    Dental   Pulmonary    Pulmonary exam normal        Cardiovascular hypertension, Pt. on medications Normal cardiovascular exam     Neuro/Psych    GI/Hepatic   Endo/Other    Renal/GU      Musculoskeletal   Abdominal   Peds  Hematology   Anesthesia Other Findings   Reproductive/Obstetrics                             Anesthesia Physical Anesthesia Plan  ASA: III  Anesthesia Plan: MAC   Post-op Pain Management:    Induction: Intravenous  PONV Risk Score and Plan: Ondansetron  Airway Management Planned: Nasal Cannula  Additional Equipment:   Intra-op Plan:   Post-operative Plan:   Informed Consent: I have reviewed the patients History and Physical, chart, labs and discussed the procedure including the risks, benefits and alternatives for the proposed anesthesia with the patient or authorized representative who has indicated his/her understanding and acceptance.       Plan Discussed with: CRNA and Surgeon  Anesthesia Plan Comments:         Anesthesia Quick Evaluation
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
## 2020-07-22 NOTE — Interval H&P Note (Signed)
History and Physical Interval Note:  07/22/2020 8:23 AM  Dillon Tran  has presented today for surgery, with the diagnosis of Right pilon fracture.  The various methods of treatment have been discussed with the patient and family. After consideration of risks, benefits and other options for treatment, the patient has consented to  Procedure(s): OPEN REDUCTION INTERNAL FIXATION (ORIF) PILON FRACTURE (Right) REMOVAL EXTERNAL FIXATION LEG (Right) as a surgical intervention.  The patient's history has been reviewed, patient examined, no change in status, stable for surgery.  I have reviewed the patient's chart and labs.  Questions were answered to the patient's satisfaction.     Lennette Bihari P Trevelle Mcgurn

## 2020-07-22 NOTE — Transfer of Care (Signed)
Immediate Anesthesia Transfer of Care Note  Patient: Dillon Tran  Procedure(s) Performed: OPEN REDUCTION INTERNAL FIXATION (ORIF) PILON FRACTURE (Right Ankle) REMOVAL EXTERNAL FIXATION LEG (Right Leg Lower)  Patient Location: PACU  Anesthesia Type:MAC  Level of Consciousness: awake, alert  and oriented  Airway & Oxygen Therapy: Patient Spontanous Breathing and Patient connected to nasal cannula oxygen  Post-op Assessment: Report given to RN and Post -op Vital signs reviewed and stable  Post vital signs: Reviewed and stable  Last Vitals:  Vitals Value Taken Time  BP 163/146 07/22/20 1212  Temp    Pulse 73 07/22/20 1213  Resp 36 07/22/20 1213  SpO2 100 % 07/22/20 1213  Vitals shown include unvalidated device data.  Last Pain:  Vitals:   07/22/20 0853  TempSrc:   PainSc: 0-No pain         Complications: No complications documented.

## 2020-07-22 NOTE — Discharge Instructions (Signed)
Dillon Hamming, MD Dillon Pace PA-C [facility] [place] [phone] [name] (fax)                                  POST-OPERATIVE INSTRUCTIONS     WEIGHT BEARING STATUS:  Non-weightbearing right lower extremity  RANGE OF MOTION/ACTIVITY:  Ok for knee motion as tolerated. Do not remove splint  WOUND CARE ? Please keep splint clean dry and intact until follow-up. If your splint gets wet for any reason please contact the office immediately.  Do not stick anything down your splint such as pencils, momey, hangers to try and scratch yourself.  If you feel itchy take Benadryl as prescribed on the bottle for itching ? You may shower on Post-Op Day #2.  ? You must keep splint dry during this process and may find that a plastic bag taped around the extremity or alternatively a towel based bath may be a better option.   ? If you get your splint wet or if it is damaged please contact our clinic.  EXERCISES ? Due to your splint being in place you will not be able to bear weight through your extremity.   ? DO NOT PUT ANY WEIGHT ON YOUR OPERATIVE LEG ? Please use crutches or a walker to avoid weight bearing.   DVT/PE prophylaxis: Lovenox  DIET: As you were eating previously.  Can use over the counter stool softeners and bowel preparations, such as Miralax, to help with bowel movements.  Narcotics can be constipating.  Be sure to drink plenty of fluids  REGIONAL ANESTHESIA (NERVE BLOCKS) . The anesthesia team may have performed a nerve block for you if safe in the setting of your care.  This is a great tool used to minimize pain.  Typically the block may start wearing off overnight but the long acting medicine may last for 3-4 days.  The nerve block wearing off can be a challenging period but please utilize your as needed pain medications to try and manage this period.    POST-OP MEDICATIONS- Multimodal approach to pain control . In general your pain  will be controlled with a combination of substances.  Prescriptions unless otherwise discussed are electronically sent to your pharmacy.  This is a carefully made plan we use to minimize narcotic use.     - Meloxicam OR Celebrex - Anti-inflammatory medication taken on a scheduled basis  - Acetaminophen - Non-narcotic pain medicine taken on a scheduled basis   - Oxycodone - This is a strong narcotic, to be used only on an "as needed" basis for pain.  -  Aspirin 81mg  - This medicine is used to minimize the risk of blood clots after surgery.             -          Zofran - take as needed for nausea   FOLLOW-UP ? If you develop a Fever (>101.5), Redness or Drainage from the surgical incision site, please call our office to arrange for an evaluation. ? Please call the office to schedule a follow-up appointment for your incision check if you do not already have one, 7-10 days post-operatively.   VISIT OUR WEBSITE FOR ADDITIONAL INFORMATION: [email or web address]   HELPFUL INFORMATION  ? If you had a block, it will wear off between 8-24 hrs postop typically.  This is period when your pain may go from  nearly zero to the pain you would have had postop without the block.  This is an abrupt transition but nothing dangerous is happening.  You may take an extra dose of narcotic when this happens.  ? You should wean off your narcotic medicines as soon as you are able.  Most patients will be off or using minimal narcotics before their first postop appointment.   ? We suggest you use the pain medication the first night prior to going to bed, in order to ease any pain when the anesthesia wears off. You should avoid taking pain medications on an empty stomach as it will make you nauseous.  ? Do not drink alcoholic beverages or take illicit drugs when taking pain medications.  ? In most states it is against the law to drive while you are in a splint or sling.  And certainly against the law to drive while taking  narcotics.  ? You may return to work/school in the next couple of days when you feel up to it.   ? Pain medication may make you constipated.  Below are a few solutions to try in this order: - Decrease the amount of pain medication if you aren't having pain. - Drink lots of decaffeinated fluids. - Drink prune juice and/or each dried prunes  o If the first 3 don't work start with additional solutions - Take Colace - an over-the-counter stool softener - Take Senokot - an over-the-counter laxative - Take Miralax - a stronger over-the-counter laxative

## 2020-07-22 NOTE — Anesthesia Postprocedure Evaluation (Signed)
Anesthesia Post Note  Patient: Briton Sellman  Procedure(s) Performed: OPEN REDUCTION INTERNAL FIXATION (ORIF) PILON FRACTURE (Right Ankle) REMOVAL EXTERNAL FIXATION LEG (Right Leg Lower)     Patient location during evaluation: PACU Anesthesia Type: MAC and Regional Level of consciousness: awake and alert and patient cooperative Pain management: pain level controlled Vital Signs Assessment: post-procedure vital signs reviewed and stable Respiratory status: spontaneous breathing and respiratory function stable Cardiovascular status: stable Anesthetic complications: no   No complications documented.  Last Vitals:  Vitals:   07/22/20 1600 07/22/20 1700  BP: 123/72 121/73  Pulse: 84 82  Resp: 17 15  Temp: (!) 36.4 C (!) 36.4 C  SpO2: 99% 99%    Last Pain:  Vitals:   07/22/20 1700  TempSrc: Oral  PainSc:                  Arbadella Kimbler DAVID

## 2020-07-23 DIAGNOSIS — S82871A Displaced pilon fracture of right tibia, initial encounter for closed fracture: Secondary | ICD-10-CM | POA: Diagnosis not present

## 2020-07-23 LAB — CBC
HCT: 22.5 % — ABNORMAL LOW (ref 39.0–52.0)
Hemoglobin: 7 g/dL — ABNORMAL LOW (ref 13.0–17.0)
MCH: 26.6 pg (ref 26.0–34.0)
MCHC: 31.1 g/dL (ref 30.0–36.0)
MCV: 85.6 fL (ref 80.0–100.0)
Platelets: 348 10*3/uL (ref 150–400)
RBC: 2.63 MIL/uL — ABNORMAL LOW (ref 4.22–5.81)
RDW: 15.7 % — ABNORMAL HIGH (ref 11.5–15.5)
WBC: 12 10*3/uL — ABNORMAL HIGH (ref 4.0–10.5)
nRBC: 0 % (ref 0.0–0.2)

## 2020-07-23 LAB — BASIC METABOLIC PANEL
Anion gap: 7 (ref 5–15)
BUN: 16 mg/dL (ref 8–23)
CO2: 25 mmol/L (ref 22–32)
Calcium: 8.7 mg/dL — ABNORMAL LOW (ref 8.9–10.3)
Chloride: 107 mmol/L (ref 98–111)
Creatinine, Ser: 0.91 mg/dL (ref 0.61–1.24)
GFR, Estimated: >60 mL/min (ref >60)
Glucose, Bld: 115 mg/dL — ABNORMAL HIGH (ref 70–99)
Potassium: 4.1 mmol/L (ref 3.5–5.1)
Sodium: 139 mmol/L (ref 135–145)

## 2020-07-23 NOTE — Progress Notes (Addendum)
Subjective: 1 Day Post-Op s/p Procedure(s): OPEN REDUCTION INTERNAL FIXATION (ORIF) PILON FRACTURE REMOVAL EXTERNAL FIXATION LEG  Patient sitting up in bed. Mildly anxious regarding mobility but does state that he will have help of daughter when returning home. Denies chest pain, shortness of breath, nausea or vomiting. Has not yet had a bowel movement. No other complaints  Objective:  PE: VITALS:   Vitals:   07/22/20 1957 07/23/20 0000 07/23/20 0407 07/23/20 0812  BP: 107/73 131/76 (!) 114/92 122/64  Pulse: (!) 105 80 79 92  Resp: 18 17 17 18   Temp: 98.6 F (37 C) 98.4 F (36.9 C) (!) 97.5 F (36.4 C) 98.4 F (36.9 C)  TempSrc: Oral Oral Oral Oral  SpO2: 98% 100% 98% 98%  Weight:      Height:       General: Alert, in no acute distress GI: abdomen soft, non-tender MSK: RLE in splint. Calf compressible. Toes warm, capillary refill intact. Sensation intact at toes. Able to flex 90 degrees at right knee.    LABS  Results for orders placed or performed during the hospital encounter of 07/22/20 (from the past 24 hour(s))  CBC     Status: Abnormal   Collection Time: 07/22/20  1:16 PM  Result Value Ref Range   WBC 10.1 4.0 - 10.5 K/uL   RBC 2.92 (L) 4.22 - 5.81 MIL/uL   Hemoglobin 7.7 (L) 13.0 - 17.0 g/dL   HCT 25.0 (L) 39 - 52 %   MCV 85.6 80.0 - 100.0 fL   MCH 26.4 26.0 - 34.0 pg   MCHC 30.8 30.0 - 36.0 g/dL   RDW 15.6 (H) 11.5 - 15.5 %   Platelets 339 150 - 400 K/uL   nRBC 0.0 0.0 - 0.2 %  Creatinine, serum     Status: None   Collection Time: 07/22/20  1:16 PM  Result Value Ref Range   Creatinine, Ser 1.00 0.61 - 1.24 mg/dL   GFR, Estimated >60 >60 mL/min  Basic metabolic panel     Status: Abnormal   Collection Time: 07/23/20  5:03 AM  Result Value Ref Range   Sodium 139 135 - 145 mmol/L   Potassium 4.1 3.5 - 5.1 mmol/L   Chloride 107 98 - 111 mmol/L   CO2 25 22 - 32 mmol/L   Glucose, Bld 115 (H) 70 - 99 mg/dL   BUN 16 8 - 23 mg/dL   Creatinine, Ser  0.91 0.61 - 1.24 mg/dL   Calcium 8.7 (L) 8.9 - 10.3 mg/dL   GFR, Estimated >60 >60 mL/min   Anion gap 7 5 - 15  CBC     Status: Abnormal   Collection Time: 07/23/20  5:03 AM  Result Value Ref Range   WBC 12.0 (H) 4.0 - 10.5 K/uL   RBC 2.63 (L) 4.22 - 5.81 MIL/uL   Hemoglobin 7.0 (L) 13.0 - 17.0 g/dL   HCT 22.5 (L) 39 - 52 %   MCV 85.6 80.0 - 100.0 fL   MCH 26.6 26.0 - 34.0 pg   MCHC 31.1 30.0 - 36.0 g/dL   RDW 15.7 (H) 11.5 - 15.5 %   Platelets 348 150 - 400 K/uL   nRBC 0.0 0.0 - 0.2 %    DG Ankle Right Port  Result Date: 07/22/2020 CLINICAL DATA:  86 year old male postop repair of ankle trauma EXAM: PORTABLE RIGHT ANKLE - 2 VIEW COMPARISON:  Multiple prior, most recent plain film 07/12/2020 FINDINGS: Casting material over the ankle and  posterior foot somewhat obscures bony details. Interval surgical changes of removal of the external fixation, with malleable buttress plate and screw fixation of the distal tibial fracture site. No hardware fracture or loosening. Anatomic alignment maintained. Ankle mortise aligned. Fracture fragments of the distal fibula are unchanged. Previous surgical changes of the foot incompletely imaged. IMPRESSION: Interval surgical change of external fixation removal with buttress plate and screw fixation of distal tibial fracture site without complicating features. Fracture fragments of the distal fibula, somewhat obscured by overlying casting material, relatively unchanged. Electronically Signed   By: Corrie Mckusick D.O.   On: 07/22/2020 15:17   DG Foot Complete Right  Result Date: 07/22/2020 CLINICAL DATA:  Right lower extremity ORIF. EXAM: RIGHT FOOT COMPLETE - 3+ VIEW; DG C-ARM 1-60 MIN COMPARISON:  Right ankle radiographs and CT 07/12/2020 FLUOROSCOPY TIME:  Fluoroscopy Time:  2 minutes 46 seconds Radiation Exposure Index (if provided by the fluoroscopic device): 5.11 mGy FINDINGS: Eight intraoperative spot fluoroscopic images are provided. Comminuted fractures  of the distal tibia and fibula are again seen, and these images demonstrate placement of 2 fixation plates and screws in the distal tibia. There is marked soft tissue swelling about the lateral aspect of the ankle. Remote midfoot arthrodesis is again noted. IMPRESSION: Intraoperative images during ORIF of distal tibia fracture. Electronically Signed   By: Logan Bores M.D.   On: 07/22/2020 13:16   DG C-Arm 1-60 Min  Result Date: 07/22/2020 CLINICAL DATA:  Right lower extremity ORIF. EXAM: RIGHT FOOT COMPLETE - 3+ VIEW; DG C-ARM 1-60 MIN COMPARISON:  Right ankle radiographs and CT 07/12/2020 FLUOROSCOPY TIME:  Fluoroscopy Time:  2 minutes 46 seconds Radiation Exposure Index (if provided by the fluoroscopic device): 5.11 mGy FINDINGS: Eight intraoperative spot fluoroscopic images are provided. Comminuted fractures of the distal tibia and fibula are again seen, and these images demonstrate placement of 2 fixation plates and screws in the distal tibia. There is marked soft tissue swelling about the lateral aspect of the ankle. Remote midfoot arthrodesis is again noted. IMPRESSION: Intraoperative images during ORIF of distal tibia fracture. Electronically Signed   By: Logan Bores M.D.   On: 07/22/2020 13:16    Assessment/Plan: Principal Problem:   Closed right pilon fracture  1 Day Post-Op s/p Procedure(s): OPEN REDUCTION INTERNAL FIXATION (ORIF) PILON FRACTURE REMOVAL EXTERNAL FIXATION LEG  Weightbearing: NWB RLE Insicional and dressing care: Splint intact VTE prophylaxis: lovenox 40 mg QD Pain control: continue current regimen, will discharge with oxycodone 5 mg q 4 hours Follow - up plan: with Dr. Doreatha Martin in 2 weeks Discharge: Plan to discharge home today after seen by PT with HHPT  Contact information:   Weekdays 8-5 Merlene Pulling, PA-C [phone] A fter hours and holidays please check [email or web address] for group call information for [facility]  Ventura Bruns 07/23/2020, 8:53 AM

## 2020-07-23 NOTE — Care Management Obs Status (Deleted)
[place] NOTIFICATION   Patient Details  Name: Dillon Tran MRN: [id number] Date of Birth: 24-May-1934   Medicare Observation Status Notification Given:       Claudie Leach, RN 07/23/2020, 10:11 AM
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
## 2020-07-23 NOTE — Care Management Obs Status (Signed)
[place] NOTIFICATION   Patient Details  Name: Dillon Tran MRN: [id number] Date of Birth: May 05, 1934   Medicare Observation Status Notification Given:  Yes    Claudie Leach, RN 07/23/2020, 10:11 AM
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
## 2020-07-23 NOTE — Progress Notes (Signed)
Patient appears to be anxious and worries about his mobility. Pt provided education
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
## 2020-07-23 NOTE — Progress Notes (Signed)
D/C education given to patient and his daughter, all questions answered. IV d/c'd. DME equipment given. Patient d/c home with daughter in no acute distress.

## 2020-07-23 NOTE — Care Management CC44 (Signed)
Condition Code 44 Documentation Completed  Patient Details  Name: Dillon Tran MRN: [id number] Date of Birth: December 08, 1933   Condition Code 44 given:  Yes Patient signature on Condition Code 44 notice:  Yes Documentation of 2 MD's agreement:  Yes Code 44 added to claim:  Yes    Claudie Leach, RN 07/23/2020, 10:11 AM

## 2020-07-24 ENCOUNTER — Encounter (HOSPITAL_COMMUNITY): Payer: Self-pay | Admitting: Student

## 2020-08-06 ENCOUNTER — Other Ambulatory Visit: Payer: Medicare HMO

## 2020-08-10 ENCOUNTER — Other Ambulatory Visit: Payer: Self-pay | Admitting: Surgery

## 2020-08-14 ENCOUNTER — Ambulatory Visit
Admission: RE | Admit: 2020-08-14 | Discharge: 2020-08-14 | Disposition: A | Discharge status: Home / Self Care | Payer: Medicare HMO | Source: Ambulatory Visit | Attending: Surgery | Admitting: Surgery

## 2020-08-14 ENCOUNTER — Other Ambulatory Visit: Payer: Self-pay

## 2020-08-14 DIAGNOSIS — K8689 Other specified diseases of pancreas: Secondary | ICD-10-CM

## 2020-08-14 MED ORDER — GADOBENATE DIMEGLUMINE 529 MG/ML IV SOLN
15.0000 mL | Freq: Once | INTRAVENOUS | Status: AC | PRN
  Administered 2020-08-14: 15 mL via INTRAVENOUS

## 2020-11-08 ENCOUNTER — Ambulatory Visit: Payer: Self-pay | Admitting: Student

## 2020-11-08 DIAGNOSIS — M86271 Subacute osteomyelitis, right ankle and foot: Secondary | ICD-10-CM

## 2020-11-10 ENCOUNTER — Encounter (HOSPITAL_COMMUNITY): Payer: Self-pay | Admitting: Student

## 2020-11-10 ENCOUNTER — Other Ambulatory Visit (HOSPITAL_COMMUNITY)
Admission: RE | Admit: 2020-11-10 | Discharge: 2020-11-10 | Disposition: A | Discharge status: Home / Self Care | Payer: Medicare HMO | Source: Ambulatory Visit | Attending: Student | Admitting: Student

## 2020-11-10 ENCOUNTER — Other Ambulatory Visit: Payer: Self-pay

## 2020-11-10 DIAGNOSIS — Z01812 Encounter for preprocedural laboratory examination: Secondary | ICD-10-CM | POA: Insufficient documentation

## 2020-11-10 DIAGNOSIS — Z20822 Contact with and (suspected) exposure to covid-19: Secondary | ICD-10-CM | POA: Insufficient documentation

## 2020-11-10 LAB — SARS CORONAVIRUS 2 (TAT 6-24 HRS): SARS Coronavirus 2: NEGATIVE

## 2020-11-10 NOTE — Progress Notes (Addendum)
Mr. Dillon Tran denies chest pain or shortness of breath. Patient was tested for Covid today and has been in quarantine since that time. Mr. Dillon Tran is not taking Metoprolol any longer, I read  Dr. Theodosia Blender note in 06/2020, it read to continue taking Metoprolol for PAF and PAC's.  Patient nor his daughter were aware of this, it was on the after discharge instructions.  I called the PCP office, [facility] in [place], I spoke  with a nurse, she said that their office did not receive information about Metoprolol.
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
## 2020-11-10 NOTE — Anesthesia Preprocedure Evaluation (Addendum)
Anesthesia Evaluation  Patient identified by MRN, date of birth, ID band Patient awake    Reviewed: Allergy & Precautions, NPO status , Patient's Chart, lab work & pertinent test results, reviewed documented beta blocker date and time   Airway Mallampati: II  TM Distance: >3 FB Neck ROM: Full    Dental no notable dental hx.    Pulmonary neg pulmonary ROS,    Pulmonary exam normal breath sounds clear to auscultation       Cardiovascular hypertension, Pt. on medications and Pt. on home beta blockers + DVT  Normal cardiovascular exam+ dysrhythmias Atrial Fibrillation + Valvular Problems/Murmurs (mild-mod) AI  Rhythm:Regular Rate:Normal     Neuro/Psych PSYCHIATRIC DISORDERS Depression negative neurological ROS     GI/Hepatic negative GI ROS, Neg liver ROS,   Endo/Other  negative endocrine ROS  Renal/GU negative Renal ROS  negative genitourinary   Musculoskeletal  (+) Arthritis , Osteoarthritis,    Abdominal   Peds  Hematology negative hematology ROS (+)   Anesthesia Other Findings   Reproductive/Obstetrics negative OB ROS                           Anesthesia Physical Anesthesia Plan  ASA: II  Anesthesia Plan: MAC and Regional   Post-op Pain Management:    Induction:   PONV Risk Score and Plan: 1 and Propofol infusion, TIVA and Treatment may vary due to age or medical condition  Airway Management Planned: Natural Airway and Simple Face Mask  Additional Equipment: None  Intra-op Plan:   Post-operative Plan:   Informed Consent: I have reviewed the patients History and Physical, chart, labs and discussed the procedure including the risks, benefits and alternatives for the proposed anesthesia with the patient or authorized representative who has indicated his/her understanding and acceptance.     Dental advisory given  Plan Discussed with: CRNA  Anesthesia Plan Comments:        Anesthesia Quick Evaluation

## 2020-11-11 ENCOUNTER — Encounter (HOSPITAL_COMMUNITY)
Admission: RE | Disposition: A | Discharge status: Home / Self Care | Payer: Self-pay | Source: Home / Self Care | Attending: Student

## 2020-11-11 ENCOUNTER — Ambulatory Visit (HOSPITAL_COMMUNITY)
Admission: RE | Admit: 2020-11-11 | Discharge: 2020-11-11 | Disposition: A | Discharge status: Home / Self Care | Payer: Medicare HMO | Attending: Student | Admitting: Student

## 2020-11-11 ENCOUNTER — Ambulatory Visit (HOSPITAL_COMMUNITY): Payer: Medicare HMO

## 2020-11-11 ENCOUNTER — Ambulatory Visit (HOSPITAL_COMMUNITY): Payer: Medicare HMO | Admitting: Vascular Surgery

## 2020-11-11 ENCOUNTER — Encounter (HOSPITAL_COMMUNITY): Payer: Self-pay | Admitting: Student

## 2020-11-11 DIAGNOSIS — Z8249 Family history of ischemic heart disease and other diseases of the circulatory system: Secondary | ICD-10-CM | POA: Insufficient documentation

## 2020-11-11 DIAGNOSIS — M1990 Unspecified osteoarthritis, unspecified site: Secondary | ICD-10-CM | POA: Insufficient documentation

## 2020-11-11 DIAGNOSIS — Z86718 Personal history of other venous thrombosis and embolism: Secondary | ICD-10-CM | POA: Insufficient documentation

## 2020-11-11 DIAGNOSIS — Z7901 Long term (current) use of anticoagulants: Secondary | ICD-10-CM | POA: Diagnosis not present

## 2020-11-11 DIAGNOSIS — I10 Essential (primary) hypertension: Secondary | ICD-10-CM | POA: Insufficient documentation

## 2020-11-11 DIAGNOSIS — I480 Paroxysmal atrial fibrillation: Secondary | ICD-10-CM | POA: Diagnosis not present

## 2020-11-11 DIAGNOSIS — Z8546 Personal history of malignant neoplasm of prostate: Secondary | ICD-10-CM | POA: Insufficient documentation

## 2020-11-11 DIAGNOSIS — E7800 Pure hypercholesterolemia, unspecified: Secondary | ICD-10-CM | POA: Insufficient documentation

## 2020-11-11 DIAGNOSIS — Z7982 Long term (current) use of aspirin: Secondary | ICD-10-CM | POA: Diagnosis not present

## 2020-11-11 DIAGNOSIS — Z87442 Personal history of urinary calculi: Secondary | ICD-10-CM | POA: Insufficient documentation

## 2020-11-11 DIAGNOSIS — Z79899 Other long term (current) drug therapy: Secondary | ICD-10-CM | POA: Insufficient documentation

## 2020-11-11 DIAGNOSIS — M86271 Subacute osteomyelitis, right ankle and foot: Secondary | ICD-10-CM | POA: Insufficient documentation

## 2020-11-11 DIAGNOSIS — Z419 Encounter for procedure for purposes other than remedying health state, unspecified: Secondary | ICD-10-CM

## 2020-11-11 HISTORY — DX: Cardiac arrhythmia, unspecified: I49.9

## 2020-11-11 HISTORY — DX: Unspecified osteoarthritis, unspecified site: M19.90

## 2020-11-11 HISTORY — DX: Depression, unspecified: F32.A

## 2020-11-11 HISTORY — PX: HARDWARE REMOVAL: SHX979

## 2020-11-11 HISTORY — DX: Pure hypercholesterolemia, unspecified: E78.00

## 2020-11-11 HISTORY — DX: Nonrheumatic aortic (valve) insufficiency: I35.1

## 2020-11-11 HISTORY — PX: I & D EXTREMITY: SHX5045

## 2020-11-11 LAB — BASIC METABOLIC PANEL
Anion gap: 10 (ref 5–15)
BUN: 15 mg/dL (ref 8–23)
CO2: 26 mmol/L (ref 22–32)
Calcium: 9.1 mg/dL (ref 8.9–10.3)
Chloride: 105 mmol/L (ref 98–111)
Creatinine, Ser: 0.95 mg/dL (ref 0.61–1.24)
GFR, Estimated: >60 mL/min (ref >60)
Glucose, Bld: 89 mg/dL (ref 70–99)
Potassium: 3.6 mmol/L (ref 3.5–5.1)
Sodium: 141 mmol/L (ref 135–145)

## 2020-11-11 LAB — CBC
HCT: 41.8 % (ref 39.0–52.0)
Hemoglobin: 12.9 g/dL — ABNORMAL LOW (ref 13.0–17.0)
MCH: 25.3 pg — ABNORMAL LOW (ref 26.0–34.0)
MCHC: 30.9 g/dL (ref 30.0–36.0)
MCV: 82.1 fL (ref 80.0–100.0)
Platelets: 180 10*3/uL (ref 150–400)
RBC: 5.09 MIL/uL (ref 4.22–5.81)
RDW: 14.9 % (ref 11.5–15.5)
WBC: 4.6 10*3/uL (ref 4.0–10.5)
nRBC: 0 % (ref 0.0–0.2)

## 2020-11-11 SURGERY — IRRIGATION AND DEBRIDEMENT EXTREMITY
Anesthesia: General | Site: Foot | Laterality: Right

## 2020-11-11 MED ORDER — HYDROCODONE-ACETAMINOPHEN 5-325 MG PO TABS: 1.0000 | ORAL_TABLET | Freq: Four times a day (QID) | ORAL | 0 refills | Status: AC | PRN

## 2020-11-11 MED ORDER — MIDAZOLAM HCL 2 MG/2ML IJ SOLN
INTRAMUSCULAR | Status: AC
  Filled 2020-11-11: qty 2

## 2020-11-11 MED ORDER — FENTANYL CITRATE (PF) 100 MCG/2ML IJ SOLN
INTRAMUSCULAR | Status: AC
  Administered 2020-11-11: 50 ug via INTRAVENOUS
  Filled 2020-11-11: qty 2

## 2020-11-11 MED ORDER — ORAL CARE MOUTH RINSE: 15.0000 mL | Freq: Once | OROMUCOSAL | Status: AC

## 2020-11-11 MED ORDER — ROPIVACAINE HCL 5 MG/ML IJ SOLN
INTRAMUSCULAR | Status: DC | PRN
  Administered 2020-11-11: 40 mL via EPIDURAL

## 2020-11-11 MED ORDER — FENTANYL CITRATE (PF) 250 MCG/5ML IJ SOLN
INTRAMUSCULAR | Status: DC | PRN
  Administered 2020-11-11: 50 ug via INTRAVENOUS

## 2020-11-11 MED ORDER — VANCOMYCIN HCL 1000 MG IV SOLR
INTRAVENOUS | Status: DC | PRN
  Administered 2020-11-11: 1000 mg via TOPICAL

## 2020-11-11 MED ORDER — CHLORHEXIDINE GLUCONATE 0.12 % MT SOLN
15.0000 mL | Freq: Once | OROMUCOSAL | Status: AC
  Administered 2020-11-11: 15 mL via OROMUCOSAL
  Filled 2020-11-11: qty 15

## 2020-11-11 MED ORDER — CEFAZOLIN SODIUM-DEXTROSE 2-4 GM/100ML-% IV SOLN
2.0000 g | INTRAVENOUS | Status: AC
  Administered 2020-11-11: 2 g via INTRAVENOUS
  Filled 2020-11-11: qty 100

## 2020-11-11 MED ORDER — FENTANYL CITRATE (PF) 100 MCG/2ML IJ SOLN: 50.0000 ug | Freq: Once | INTRAMUSCULAR | Status: AC

## 2020-11-11 MED ORDER — LIDOCAINE 2% (20 MG/ML) 5 ML SYRINGE
INTRAMUSCULAR | Status: AC
  Filled 2020-11-11: qty 5

## 2020-11-11 MED ORDER — PROPOFOL 10 MG/ML IV BOLUS
INTRAVENOUS | Status: AC
  Filled 2020-11-11: qty 20

## 2020-11-11 MED ORDER — KETAMINE HCL 50 MG/5ML IJ SOSY
PREFILLED_SYRINGE | INTRAMUSCULAR | Status: AC
  Filled 2020-11-11: qty 5

## 2020-11-11 MED ORDER — FENTANYL CITRATE (PF) 250 MCG/5ML IJ SOLN
INTRAMUSCULAR | Status: AC
  Filled 2020-11-11: qty 5

## 2020-11-11 MED ORDER — CIPROFLOXACIN HCL 500 MG PO TABS: 500.0000 mg | ORAL_TABLET | Freq: Two times a day (BID) | ORAL | 0 refills | Status: AC

## 2020-11-11 MED ORDER — PROPOFOL 500 MG/50ML IV EMUL
INTRAVENOUS | Status: DC | PRN
  Administered 2020-11-11: 75 ug/kg/min via INTRAVENOUS

## 2020-11-11 MED ORDER — ONDANSETRON HCL 4 MG/2ML IJ SOLN
INTRAMUSCULAR | Status: AC
  Filled 2020-11-11: qty 2

## 2020-11-11 MED ORDER — LIDOCAINE 2% (20 MG/ML) 5 ML SYRINGE
INTRAMUSCULAR | Status: DC | PRN
  Administered 2020-11-11: 40 mg via INTRAVENOUS

## 2020-11-11 MED ORDER — KETAMINE HCL 10 MG/ML IJ SOLN
INTRAMUSCULAR | Status: DC | PRN
  Administered 2020-11-11: 15 mg via INTRAVENOUS

## 2020-11-11 MED ORDER — ONDANSETRON HCL 4 MG/2ML IJ SOLN
INTRAMUSCULAR | Status: DC | PRN
  Administered 2020-11-11: 4 mg via INTRAVENOUS

## 2020-11-11 MED ORDER — DEXAMETHASONE SODIUM PHOSPHATE 10 MG/ML IJ SOLN
INTRAMUSCULAR | Status: DC | PRN
  Administered 2020-11-11: 10 mg via INTRAVENOUS

## 2020-11-11 MED ORDER — LACTATED RINGERS IV SOLN: INTRAVENOUS | Status: DC

## 2020-11-11 MED ORDER — EPHEDRINE 5 MG/ML INJ
INTRAVENOUS | Status: AC
  Filled 2020-11-11: qty 10

## 2020-11-11 MED ORDER — 0.9 % SODIUM CHLORIDE (POUR BTL) OPTIME
TOPICAL | Status: DC | PRN
  Administered 2020-11-11: 1000 mL

## 2020-11-11 MED ORDER — PROPOFOL 10 MG/ML IV BOLUS
INTRAVENOUS | Status: DC | PRN
Start: 2020-11-11 — End: 2020-11-11
  Administered 2020-11-11: 30 mg via INTRAVENOUS

## 2020-11-11 MED ORDER — PHENYLEPHRINE 40 MCG/ML (10ML) SYRINGE FOR IV PUSH (FOR BLOOD PRESSURE SUPPORT)
PREFILLED_SYRINGE | INTRAVENOUS | Status: AC
  Filled 2020-11-11: qty 10

## 2020-11-11 MED ORDER — VANCOMYCIN HCL 1000 MG IV SOLR
INTRAVENOUS | Status: AC
  Filled 2020-11-11: qty 1000

## 2020-11-11 SURGICAL SUPPLY — 81 items
APL PRP STRL LF DISP 70% ISPRP (MISCELLANEOUS) ×1
BANDAGE ESMARK 6X9 LF (GAUZE/BANDAGES/DRESSINGS) IMPLANT
BNDG CMPR 9X6 STRL LF SNTH (GAUZE/BANDAGES/DRESSINGS)
BNDG COHESIVE 4X5 TAN STRL (GAUZE/BANDAGES/DRESSINGS) IMPLANT
BNDG COHESIVE 6X5 TAN STRL LF (GAUZE/BANDAGES/DRESSINGS) IMPLANT
BNDG ELASTIC 4X5.8 VLCR STR LF (GAUZE/BANDAGES/DRESSINGS) ×4 IMPLANT
BNDG ELASTIC 6X5.8 VLCR STR LF (GAUZE/BANDAGES/DRESSINGS) IMPLANT
BNDG ESMARK 6X9 LF (GAUZE/BANDAGES/DRESSINGS)
BNDG GAUZE ELAST 4 BULKY (GAUZE/BANDAGES/DRESSINGS) IMPLANT
BRUSH SCRUB EZ PLAIN DRY (MISCELLANEOUS) ×2 IMPLANT
CHLORAPREP W/TINT 26 (MISCELLANEOUS) ×2 IMPLANT
CNTNR URN SCR LID CUP LEK RST (MISCELLANEOUS) ×1 IMPLANT
CONT SPEC 4OZ STRL OR WHT (MISCELLANEOUS) ×2
COVER MAYO STAND STRL (DRAPES) ×2 IMPLANT
COVER SURGICAL LIGHT HANDLE (MISCELLANEOUS) ×4 IMPLANT
COVER WAND RF STERILE (DRAPES) IMPLANT
CUFF TOURN SGL QUICK 18X4 (TOURNIQUET CUFF) IMPLANT
CUFF TOURN SGL QUICK 24 (TOURNIQUET CUFF)
CUFF TOURN SGL QUICK 34 (TOURNIQUET CUFF)
CUFF TRNQT CYL 24X4X16.5-23 (TOURNIQUET CUFF) IMPLANT
CUFF TRNQT CYL 34X4.125X (TOURNIQUET CUFF) IMPLANT
DRAPE C-ARM 42X72 X-RAY (DRAPES) ×2 IMPLANT
DRAPE C-ARMOR (DRAPES) ×2 IMPLANT
DRAPE ORTHO SPLIT 77X108 STRL (DRAPES) ×2
DRAPE SURG 17X23 STRL (DRAPES) IMPLANT
DRAPE SURG ORHT 6 SPLT 77X108 (DRAPES) ×1 IMPLANT
DRAPE U-SHAPE 47X51 STRL (DRAPES) ×2 IMPLANT
DRSG ADAPTIC 3X8 NADH LF (GAUZE/BANDAGES/DRESSINGS) IMPLANT
DRSG MEPITEL 4X7.2 (GAUZE/BANDAGES/DRESSINGS) ×2 IMPLANT
ELECT BLADE 4.0 EZ CLEAN MEGAD (MISCELLANEOUS) ×2
ELECT REM PT RETURN 9FT ADLT (ELECTROSURGICAL) ×2
ELECTRODE BLDE 4.0 EZ CLN MEGD (MISCELLANEOUS) ×1 IMPLANT
ELECTRODE REM PT RTRN 9FT ADLT (ELECTROSURGICAL) ×1 IMPLANT
EVACUATOR 1/8 PVC DRAIN (DRAIN) IMPLANT
GAUZE SPONGE 4X4 12PLY STRL (GAUZE/BANDAGES/DRESSINGS) ×2 IMPLANT
GLOVE BIO SURGEON STRL SZ 6.5 (GLOVE) ×6 IMPLANT
GLOVE BIO SURGEON STRL SZ7.5 (GLOVE) ×8 IMPLANT
GLOVE BIOGEL PI IND STRL 7.5 (GLOVE) ×1 IMPLANT
GLOVE BIOGEL PI INDICATOR 7.5 (GLOVE) ×1
GLOVE SURG UNDER POLY LF SZ6.5 (GLOVE) ×2 IMPLANT
GOWN STRL REUS W/ TWL LRG LVL3 (GOWN DISPOSABLE) ×2 IMPLANT
GOWN STRL REUS W/TWL LRG LVL3 (GOWN DISPOSABLE) ×4
HANDPIECE INTERPULSE COAX TIP (DISPOSABLE)
KIT BASIN OR (CUSTOM PROCEDURE TRAY) ×2 IMPLANT
KIT TURNOVER KIT B (KITS) ×2 IMPLANT
MANIFOLD NEPTUNE II (INSTRUMENTS) ×2 IMPLANT
NEEDLE 22X1 1/2 (OR ONLY) (NEEDLE) IMPLANT
NS IRRIG 1000ML POUR BTL (IV SOLUTION) ×2 IMPLANT
PACK ORTHO EXTREMITY (CUSTOM PROCEDURE TRAY) ×2 IMPLANT
PAD ARMBOARD 7.5X6 YLW CONV (MISCELLANEOUS) ×4 IMPLANT
PAD CAST 4YDX4 CTTN HI CHSV (CAST SUPPLIES) ×2 IMPLANT
PADDING CAST COTTON 4X4 STRL (CAST SUPPLIES) ×4
PADDING CAST COTTON 6X4 STRL (CAST SUPPLIES) IMPLANT
SET HNDPC FAN SPRY TIP SCT (DISPOSABLE) IMPLANT
SPONGE LAP 18X18 RF (DISPOSABLE) ×2 IMPLANT
STAPLER VISISTAT 35W (STAPLE) ×2 IMPLANT
STOCKINETTE IMPERVIOUS LG (DRAPES) ×2 IMPLANT
STRIP CLOSURE SKIN 1/2X4 (GAUZE/BANDAGES/DRESSINGS) IMPLANT
SUCTION FRAZIER HANDLE 10FR (MISCELLANEOUS)
SUCTION FRAZIER TIP 8 FR DISP (SUCTIONS) ×1
SUCTION TUBE FRAZIER 10FR DISP (MISCELLANEOUS) IMPLANT
SUCTION TUBE FRAZIER 8FR DISP (SUCTIONS) ×1 IMPLANT
SUT ETHILON 2 0 FS 18 (SUTURE) IMPLANT
SUT ETHILON 3 0 PS 1 (SUTURE) ×4 IMPLANT
SUT MNCRL AB 3-0 PS2 18 (SUTURE) IMPLANT
SUT MON AB 2-0 CT1 36 (SUTURE) ×2 IMPLANT
SUT PDS AB 0 CT 36 (SUTURE) IMPLANT
SUT PDS AB 2-0 CT1 27 (SUTURE) IMPLANT
SUT VIC AB 0 CT1 27 (SUTURE)
SUT VIC AB 0 CT1 27XBRD ANBCTR (SUTURE) IMPLANT
SUT VIC AB 2-0 CT1 27 (SUTURE)
SUT VIC AB 2-0 CT1 TAPERPNT 27 (SUTURE) IMPLANT
SWAB COLLECTION DEVICE MRSA (MISCELLANEOUS) ×4 IMPLANT
SWAB CULTURE ESWAB REG 1ML (MISCELLANEOUS) ×4 IMPLANT
SYR CONTROL 10ML LL (SYRINGE) IMPLANT
TOWEL GREEN STERILE (TOWEL DISPOSABLE) ×4 IMPLANT
TOWEL GREEN STERILE FF (TOWEL DISPOSABLE) ×4 IMPLANT
TUBE CONNECTING 12X1/4 (SUCTIONS) ×2 IMPLANT
UNDERPAD 30X36 HEAVY ABSORB (UNDERPADS AND DIAPERS) ×2 IMPLANT
WATER STERILE IRR 1000ML POUR (IV SOLUTION) IMPLANT
YANKAUER SUCT BULB TIP NO VENT (SUCTIONS) ×2 IMPLANT

## 2020-11-11 NOTE — Anesthesia Postprocedure Evaluation (Signed)
Anesthesia Post Note  Patient: Korde Jeppsen  Procedure(s) Performed: IRRIGATION AND DEBRIDEMENT EXTREMITY (Right Foot) HARDWARE REMOVAL (Right Foot)     Patient location during evaluation: PACU Anesthesia Type: General Level of consciousness: awake Pain management: pain level controlled Vital Signs Assessment: post-procedure vital signs reviewed and stable Respiratory status: spontaneous breathing and respiratory function stable Cardiovascular status: stable Postop Assessment: no apparent nausea or vomiting Anesthetic complications: no   No complications documented.  Last Vitals:  Vitals:   11/11/20 1230 11/11/20 1248  BP:  (!) 163/98  Pulse: (!) 55 61  Resp: 15 15  Temp:  36.7 C  SpO2: 100% 100%    Last Pain:  Vitals:   11/11/20 0900  TempSrc:   PainSc: 0-No pain                 Merlinda Frederick
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
## 2020-11-11 NOTE — Discharge Instructions (Signed)
Weight bearing as tolerated right lower leg

## 2020-11-11 NOTE — Transfer of Care (Signed)
Immediate Anesthesia Transfer of Care Note  Patient: Teion Ballin  Procedure(s) Performed: IRRIGATION AND DEBRIDEMENT EXTREMITY (Right Foot) HARDWARE REMOVAL (Right Foot)  Patient Location: PACU  Anesthesia Type:MAC and Regional  Level of Consciousness: awake, oriented, drowsy, patient cooperative and responds to stimulation  Airway & Oxygen Therapy: Patient Spontanous Breathing and Patient connected to face mask oxygen  Post-op Assessment: Report given to RN, Post -op Vital signs reviewed and stable and Patient moving all extremities X 4  Post vital signs: Reviewed and stable  Last Vitals:  Vitals Value Taken Time  BP 108/83 11/11/20 1135  Temp    Pulse 63 11/11/20 1134  Resp 55 11/11/20 1134  SpO2 100 % 11/11/20 1134  Vitals shown include unvalidated device data.  Last Pain:  Vitals:   11/11/20 0900  TempSrc:   PainSc: 0-No pain      Patients Stated Pain Goal: 3 (17/35/67 0141)  Complications: No complications documented.
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
## 2020-11-11 NOTE — Anesthesia Procedure Notes (Signed)
Anesthesia Regional Block: Popliteal block   Pre-Anesthetic Checklist: ,, timeout performed, Correct Patient, Correct Site, Correct Laterality, Correct Procedure, Correct Position, site marked, Risks and benefits discussed,  Surgical consent,  Pre-op evaluation,  At surgeon's request and post-op pain management  Laterality: Right  Prep: Maximum Sterile Barrier Precautions used, chloraprep       Needles:  Injection technique: Single-shot  Needle Type: Echogenic Stimulator Needle     Needle Length: 9cm  Needle Gauge: 22     Additional Needles:   Procedures:,,,, ultrasound used (permanent image in chart),,,,  Narrative:  Start time: 11/11/2020 8:50 AM End time: 11/11/2020 8:55 AM Injection made incrementally with aspirations every 5 mL.  Performed by: Personally  Anesthesiologist: Pervis Hocking, DO  Additional Notes: Monitors applied. No increased pain on injection. No increased resistance to injection. Injection made in 5cc increments. Good needle visualization. Patient tolerated procedure well.

## 2020-11-11 NOTE — Progress Notes (Signed)
Orthopedic Tech Progress Note Patient Details:  Dillon Tran 1934-07-03 [id number] PACU RN called requesting a POST OP SHOE for patient Ortho Devices Type of Ortho Device: Postop shoe/boot Ortho Device/Splint Location: RLE Ortho Device/Splint Interventions: Ordered,Application   Post Interventions Patient Tolerated: Well Instructions Provided: Care of device   Janit Pagan 11/11/2020, 1:58 PM

## 2020-11-11 NOTE — Anesthesia Procedure Notes (Signed)
Anesthesia Regional Block: Adductor canal block   Pre-Anesthetic Checklist: ,, timeout performed, Correct Patient, Correct Site, Correct Laterality, Correct Procedure, Correct Position, site marked, Risks and benefits discussed,  Surgical consent,  Pre-op evaluation,  At surgeon's request and post-op pain management  Laterality: Right  Prep: Maximum Sterile Barrier Precautions used, chloraprep       Needles:  Injection technique: Single-shot  Needle Type: Echogenic Stimulator Needle     Needle Length: 9cm  Needle Gauge: 22     Additional Needles:   Procedures:,,,, ultrasound used (permanent image in chart),,,,  Narrative:  Start time: 11/11/2020 8:55 AM End time: 11/11/2020 9:00 AM Injection made incrementally with aspirations every 5 mL.  Performed by: Personally  Anesthesiologist: Pervis Hocking, DO  Additional Notes: Monitors applied. No increased pain on injection. No increased resistance to injection. Injection made in 5cc increments. Good needle visualization. Patient tolerated procedure well.

## 2020-11-14 ENCOUNTER — Encounter (HOSPITAL_COMMUNITY): Payer: Self-pay | Admitting: Student

## 2020-11-16 LAB — AEROBIC/ANAEROBIC CULTURE W GRAM STAIN (SURGICAL/DEEP WOUND)

## 2020-11-18 LAB — AEROBIC/ANAEROBIC CULTURE W GRAM STAIN (SURGICAL/DEEP WOUND)

## 2020-12-07 ENCOUNTER — Ambulatory Visit: Payer: Medicare HMO | Admitting: Internal Medicine

## 2020-12-07 ENCOUNTER — Other Ambulatory Visit: Payer: Self-pay

## 2020-12-07 ENCOUNTER — Encounter: Payer: Self-pay | Admitting: Internal Medicine

## 2020-12-07 VITALS — BP 136/84 | HR 80 | Temp 97.9°F | Wt 162.0 lb

## 2020-12-07 DIAGNOSIS — M86271 Subacute osteomyelitis, right ankle and foot: Secondary | ICD-10-CM | POA: Diagnosis not present

## 2020-12-07 DIAGNOSIS — T847XXA Infection and inflammatory reaction due to other internal orthopedic prosthetic devices, implants and grafts, initial encounter: Secondary | ICD-10-CM | POA: Diagnosis not present

## 2020-12-07 DIAGNOSIS — T8149XA Infection following a procedure, other surgical site, initial encounter: Secondary | ICD-10-CM

## 2020-12-07 DIAGNOSIS — M869 Osteomyelitis, unspecified: Secondary | ICD-10-CM

## 2020-12-07 MED ORDER — CIPROFLOXACIN HCL 500 MG PO TABS: 500.0000 mg | ORAL_TABLET | Freq: Two times a day (BID) | ORAL | 1 refills | Status: AC

## 2020-12-07 NOTE — Patient Instructions (Signed)
Your infection are currently controlled with the ciprofloxacin   I am waiting on dr Haddix to answer whether or not the hardware in the ankle was infected/invovled in the hindfoot infection  Will get blood test today and see you in 4 week for reevaluation
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
## 2020-12-08 LAB — COMPLETE METABOLIC PANEL WITH GFR
AG Ratio: 1.5 (calc) (ref 1.0–2.5)
ALT: 12 U/L (ref 9–46)
AST: 18 U/L (ref 10–35)
Albumin: 3.9 g/dL (ref 3.6–5.1)
Alkaline phosphatase (APISO): 103 U/L (ref 35–144)
BUN: 18 mg/dL (ref 7–25)
CO2: 31 mmol/L (ref 20–32)
Calcium: 9.1 mg/dL (ref 8.6–10.3)
Chloride: 106 mmol/L (ref 98–110)
Creat: 1.07 mg/dL (ref 0.70–1.11)
GFR, Est African American: 73 mL/min/{1.73_m2} (ref >=60)
GFR, Est Non African American: 63 mL/min/{1.73_m2} (ref >=60)
Globulin: 2.6 g/dL (calc) (ref 1.9–3.7)
Glucose, Bld: 80 mg/dL (ref 65–99)
Potassium: 4 mmol/L (ref 3.5–5.3)
Sodium: 144 mmol/L (ref 135–146)
Total Bilirubin: 0.4 mg/dL (ref 0.2–1.2)
Total Protein: 6.5 g/dL (ref 6.1–8.1)

## 2020-12-08 LAB — CBC
HCT: 38.7 % (ref 38.5–50.0)
Hemoglobin: 12 g/dL — ABNORMAL LOW (ref 13.2–17.1)
MCH: 25.9 pg — ABNORMAL LOW (ref 27.0–33.0)
MCHC: 31 g/dL — ABNORMAL LOW (ref 32.0–36.0)
MCV: 83.4 fL (ref 80.0–100.0)
MPV: 10.4 fL (ref 7.5–12.5)
Platelets: 190 10*3/uL (ref 140–400)
RBC: 4.64 10*6/uL (ref 4.20–5.80)
RDW: 14.9 % (ref 11.0–15.0)
WBC: 4.6 10*3/uL (ref 3.8–10.8)

## 2020-12-08 LAB — C-REACTIVE PROTEIN: CRP: 1.9 mg/L (ref <8.0)

## 2020-12-13 ENCOUNTER — Encounter: Payer: Self-pay | Admitting: Internal Medicine

## 2021-01-10 ENCOUNTER — Encounter: Payer: Self-pay | Admitting: Internal Medicine

## 2021-01-10 ENCOUNTER — Other Ambulatory Visit: Payer: Self-pay

## 2021-01-10 ENCOUNTER — Ambulatory Visit: Payer: Medicare HMO | Admitting: Internal Medicine

## 2021-01-10 VITALS — BP 136/85 | HR 78 | Wt 156.0 lb

## 2021-01-10 DIAGNOSIS — M86271 Subacute osteomyelitis, right ankle and foot: Secondary | ICD-10-CM

## 2021-01-10 DIAGNOSIS — T847XXA Infection and inflammatory reaction due to other internal orthopedic prosthetic devices, implants and grafts, initial encounter: Secondary | ICD-10-CM | POA: Diagnosis not present
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
## 2021-01-10 NOTE — Patient Instructions (Signed)
Thank you for coming to see me today. It was a pleasure seeing you.  To Do: Marland Kitchen Stop taking your ciprofloxacin as you have now finished about 7 weeks of antibiotics . You can take your vitamin D . Follow up with orthopedic surgery as scheduled . Follow up with Dr Gale Journey or myself in about 4 weeks  If you have any questions or concerns, please do not hesitate to call the office at [phone].  Take Care,   Jule Ser, DO

## 2021-01-31 ENCOUNTER — Ambulatory Visit: Payer: Medicare HMO | Admitting: Internal Medicine

## 2021-02-01 ENCOUNTER — Ambulatory Visit: Payer: Medicare HMO | Admitting: Internal Medicine

## 2021-02-06 ENCOUNTER — Ambulatory Visit: Payer: Medicare HMO | Admitting: Internal Medicine

## 2021-02-06 ENCOUNTER — Other Ambulatory Visit: Payer: Self-pay

## 2021-02-06 ENCOUNTER — Encounter: Payer: Self-pay | Admitting: Internal Medicine

## 2021-02-06 VITALS — BP 117/77 | HR 73 | Temp 97.9°F | Wt 154.0 lb

## 2021-02-06 DIAGNOSIS — T847XXA Infection and inflammatory reaction due to other internal orthopedic prosthetic devices, implants and grafts, initial encounter: Secondary | ICD-10-CM

## 2021-02-06 DIAGNOSIS — M86271 Subacute osteomyelitis, right ankle and foot: Secondary | ICD-10-CM | POA: Diagnosis not present
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
## 2021-02-06 NOTE — Patient Instructions (Signed)
Thank you for coming to see me today. It was a pleasure seeing you.  To Do: Marland Kitchen Follow up with me as needed for any concerns  If you have any questions or concerns, please do not hesitate to call the office at [phone].  Take Care,   Jule Ser, DO

## 2021-08-13 IMAGING — DX DG ANKLE PORT 2V*R*
3 series · 3 of 3 positions shown · non-contrast
Comparison: Multiple prior, most recent plain film 07/12/2020

CLINICAL DATA: 86-year-old male postop repair of ankle trauma

EXAM:
PORTABLE RIGHT ANKLE - 2 VIEW

[ankle ap]
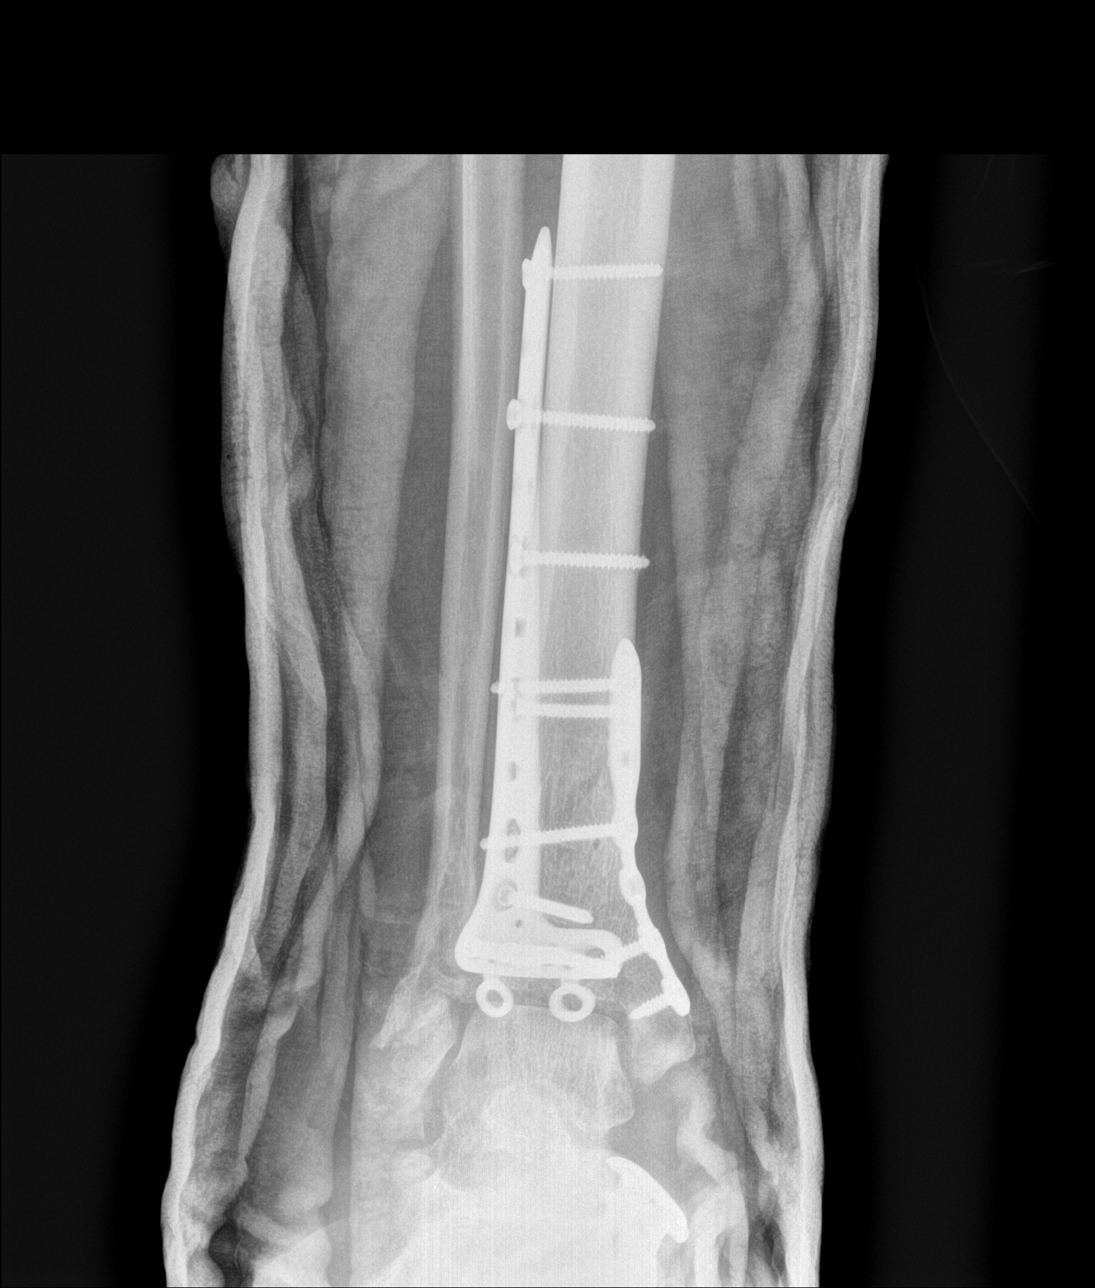

[ankle lat]
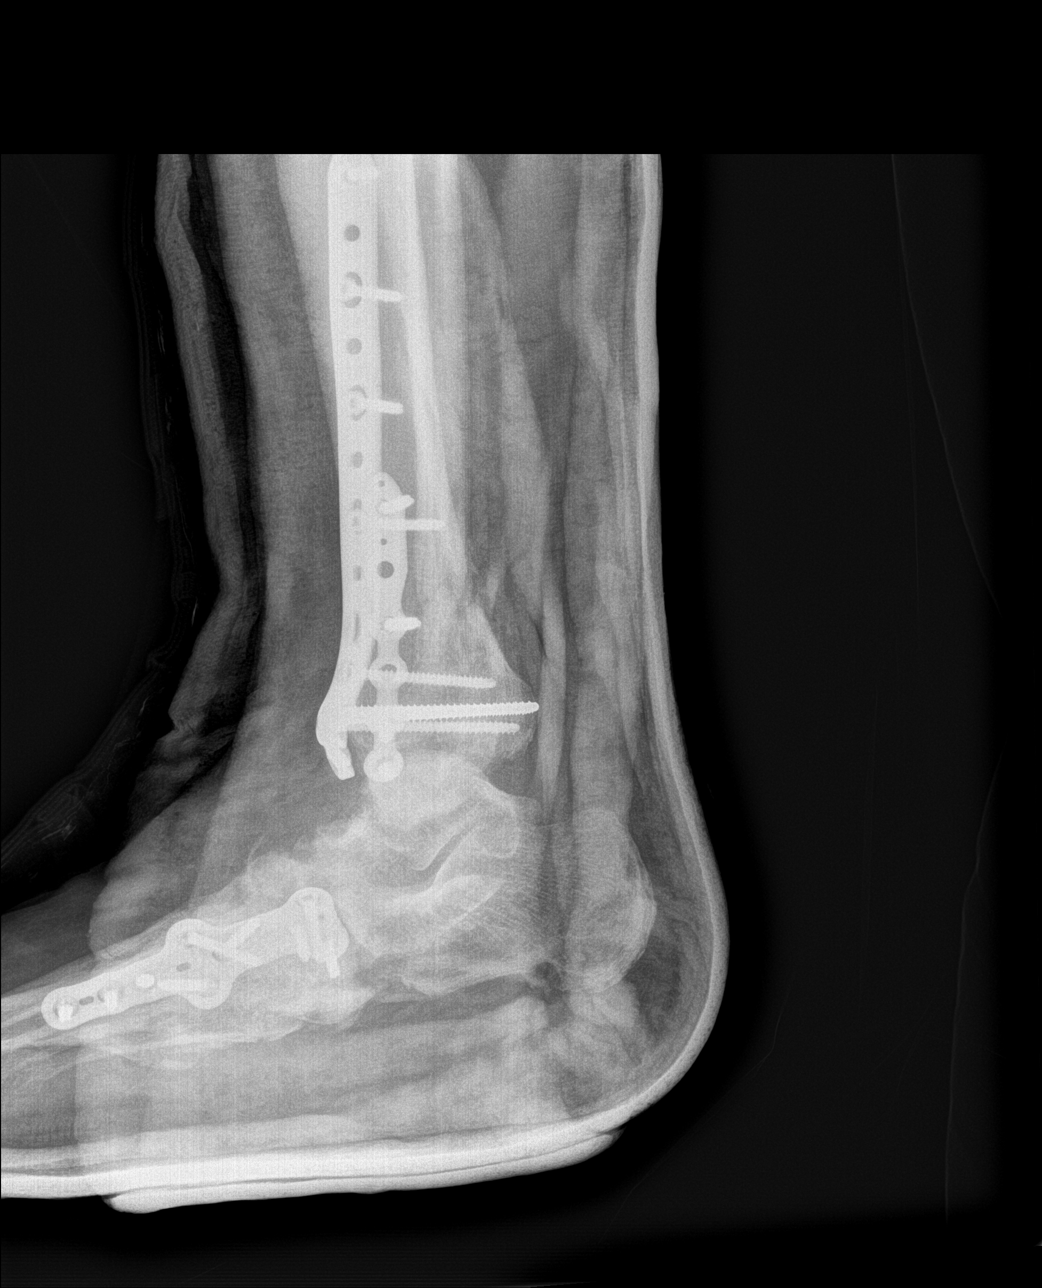

[ankle obl]
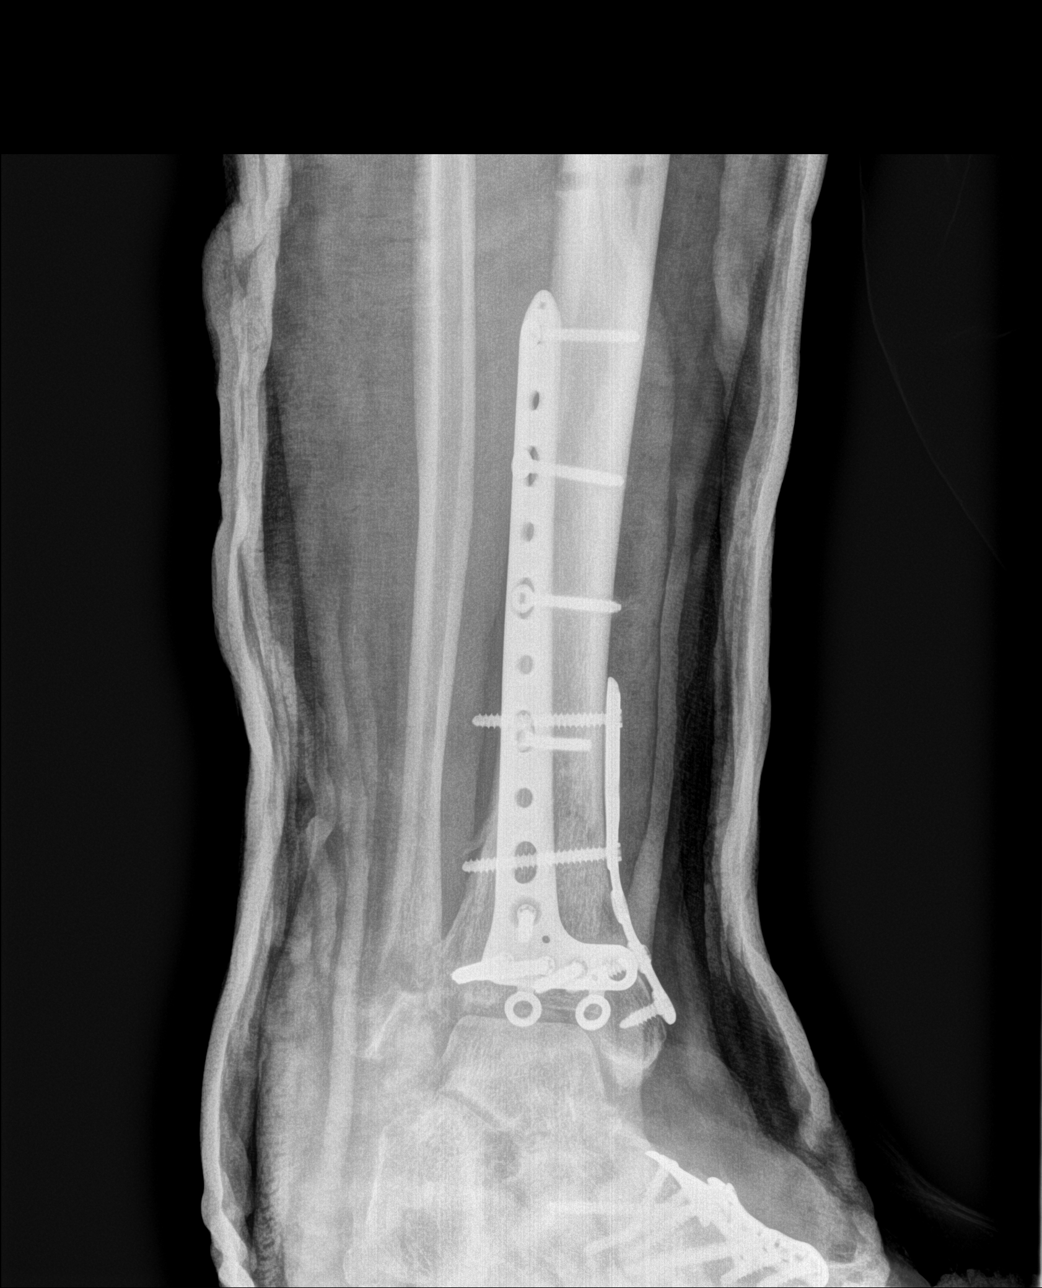

[3 of 3 positions shown; findings below may reference images not displayed]

FINDINGS: Casting material over the ankle and posterior foot somewhat obscures
bony details. Interval surgical changes of removal of the external
fixation, with malleable buttress plate and screw fixation of the
distal tibial fracture site. No hardware fracture or loosening.
Anatomic alignment maintained. Ankle mortise aligned. Fracture
fragments of the distal fibula are unchanged. Previous surgical
changes of the foot incompletely imaged.
IMPRESSION: Interval surgical change of external fixation removal with buttress
plate and screw fixation of distal tibial fracture site without
complicating features.

Fracture fragments of the distal fibula, somewhat obscured by
overlying casting material, relatively unchanged.

## 2022-05-04 ENCOUNTER — Other Ambulatory Visit: Payer: Self-pay | Admitting: Student

## 2022-05-04 DIAGNOSIS — M19171 Post-traumatic osteoarthritis, right ankle and foot: Secondary | ICD-10-CM

## 2022-05-09 ENCOUNTER — Inpatient Hospital Stay: Admission: RE | Admit: 2022-05-09 | Payer: Medicare HMO | Source: Ambulatory Visit

## 2022-05-14 ENCOUNTER — Other Ambulatory Visit: Payer: Medicare HMO

## 2022-05-15 ENCOUNTER — Ambulatory Visit
Admission: RE | Admit: 2022-05-15 | Discharge: 2022-05-15 | Disposition: A | Discharge status: Home / Self Care | Payer: Medicare HMO | Source: Ambulatory Visit | Attending: Student | Admitting: Student

## 2022-05-15 DIAGNOSIS — M19171 Post-traumatic osteoarthritis, right ankle and foot: Secondary | ICD-10-CM

## 2022-05-15 MED ORDER — METHYLPREDNISOLONE ACETATE 40 MG/ML INJ SUSP (RADIOLOG
80.0000 mg | Freq: Once | INTRAMUSCULAR | Status: AC
  Administered 2022-05-15: 80 mg via INTRA_ARTICULAR

## 2022-05-15 MED ORDER — IOPAMIDOL (ISOVUE-M 200) INJECTION 41%
1.0000 mL | Freq: Once | INTRAMUSCULAR | Status: AC
  Administered 2022-05-15: 1 mL via INTRA_ARTICULAR
